# Patient Record
Sex: Female | Born: 1980 | Race: White | Hispanic: No | Marital: Married | Smoking: Never smoker
Health system: Southern US, Community
[De-identification: ages and names within clinical notes are randomized; demographics above are authoritative.]

## PROBLEM LIST (undated history)

## (undated) ENCOUNTER — Inpatient Hospital Stay (HOSPITAL_COMMUNITY): Payer: Self-pay

## (undated) DIAGNOSIS — Z98811 Dental restoration status: Secondary | ICD-10-CM

## (undated) DIAGNOSIS — J45909 Unspecified asthma, uncomplicated: Secondary | ICD-10-CM

## (undated) DIAGNOSIS — F419 Anxiety disorder, unspecified: Secondary | ICD-10-CM

## (undated) DIAGNOSIS — I341 Nonrheumatic mitral (valve) prolapse: Secondary | ICD-10-CM

## (undated) DIAGNOSIS — K4090 Unilateral inguinal hernia, without obstruction or gangrene, not specified as recurrent: Secondary | ICD-10-CM

## (undated) HISTORY — PX: MANDIBLE FRACTURE SURGERY: SHX706

## (undated) HISTORY — PX: ORIF FEMUR FRACTURE: SHX2119

## (undated) HISTORY — PX: WISDOM TOOTH EXTRACTION: SHX21

## (undated) HISTORY — PX: NASAL FRACTURE SURGERY: SHX718

## (undated) HISTORY — PX: KNEE CARTILAGE SURGERY: SHX688

---
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
## 1998-10-13 ENCOUNTER — Emergency Department (HOSPITAL_COMMUNITY): Admission: EM | Admit: 1998-10-13 | Discharge: 1998-10-13 | Payer: Self-pay

## 1998-11-17 ENCOUNTER — Other Ambulatory Visit: Admission: RE | Admit: 1998-11-17 | Discharge: 1998-11-17 | Payer: Self-pay | Admitting: Obstetrics and Gynecology

## 1998-11-27 ENCOUNTER — Emergency Department (HOSPITAL_COMMUNITY): Admission: EM | Admit: 1998-11-27 | Discharge: 1998-11-27 | Payer: Self-pay | Admitting: Emergency Medicine

## 1999-02-10 ENCOUNTER — Ambulatory Visit (HOSPITAL_BASED_OUTPATIENT_CLINIC_OR_DEPARTMENT_OTHER): Admission: RE | Admit: 1999-02-10 | Discharge: 1999-02-10 | Payer: Self-pay | Admitting: Dentistry

## 1999-03-28 HISTORY — PX: HARDWARE REMOVAL: SHX979

## 2000-04-19 ENCOUNTER — Other Ambulatory Visit: Admission: RE | Admit: 2000-04-19 | Discharge: 2000-04-19 | Payer: Self-pay | Admitting: Obstetrics and Gynecology

## 2000-07-08 ENCOUNTER — Emergency Department (HOSPITAL_COMMUNITY): Admission: EM | Admit: 2000-07-08 | Discharge: 2000-07-08 | Payer: Self-pay | Admitting: Emergency Medicine

## 2001-05-22 ENCOUNTER — Other Ambulatory Visit: Admission: RE | Admit: 2001-05-22 | Discharge: 2001-05-22 | Payer: Self-pay | Admitting: Obstetrics and Gynecology

## 2002-05-08 ENCOUNTER — Emergency Department (HOSPITAL_COMMUNITY): Admission: EM | Admit: 2002-05-08 | Discharge: 2002-05-09 | Payer: Self-pay | Admitting: Emergency Medicine

## 2002-05-08 ENCOUNTER — Encounter: Payer: Self-pay | Admitting: Emergency Medicine

## 2002-09-11 ENCOUNTER — Other Ambulatory Visit: Admission: RE | Admit: 2002-09-11 | Discharge: 2002-09-11 | Payer: Self-pay | Admitting: Obstetrics and Gynecology

## 2003-12-17 ENCOUNTER — Other Ambulatory Visit: Admission: RE | Admit: 2003-12-17 | Discharge: 2003-12-17 | Payer: Self-pay | Admitting: Obstetrics and Gynecology

## 2004-01-27 ENCOUNTER — Emergency Department (HOSPITAL_COMMUNITY): Admission: EM | Admit: 2004-01-27 | Discharge: 2004-01-27 | Payer: Self-pay

## 2004-11-17 ENCOUNTER — Ambulatory Visit: Payer: Self-pay | Admitting: Internal Medicine

## 2004-12-20 ENCOUNTER — Other Ambulatory Visit: Admission: RE | Admit: 2004-12-20 | Discharge: 2004-12-20 | Payer: Self-pay | Admitting: Obstetrics and Gynecology

## 2006-10-17 ENCOUNTER — Encounter: Payer: Self-pay | Admitting: Internal Medicine

## 2011-02-01 ENCOUNTER — Other Ambulatory Visit (HOSPITAL_COMMUNITY): Payer: Self-pay | Admitting: Obstetrics and Gynecology

## 2011-02-01 DIAGNOSIS — N971 Female infertility of tubal origin: Secondary | ICD-10-CM

## 2011-02-07 ENCOUNTER — Ambulatory Visit (HOSPITAL_COMMUNITY)
Admission: RE | Admit: 2011-02-07 | Discharge: 2011-02-07 | Payer: BC Managed Care – PPO | Source: Ambulatory Visit | Attending: Obstetrics and Gynecology | Admitting: Obstetrics and Gynecology

## 2011-02-28 ENCOUNTER — Other Ambulatory Visit (HOSPITAL_COMMUNITY): Payer: Self-pay | Admitting: Obstetrics and Gynecology

## 2011-02-28 DIAGNOSIS — N971 Female infertility of tubal origin: Secondary | ICD-10-CM

## 2011-03-06 ENCOUNTER — Other Ambulatory Visit (HOSPITAL_COMMUNITY): Payer: Self-pay | Admitting: Obstetrics and Gynecology

## 2011-03-06 ENCOUNTER — Ambulatory Visit (HOSPITAL_COMMUNITY)
Admission: RE | Admit: 2011-03-06 | Discharge: 2011-03-06 | Disposition: A | Discharge status: Home / Self Care | Payer: BC Managed Care – PPO | Source: Ambulatory Visit | Attending: Obstetrics and Gynecology | Admitting: Obstetrics and Gynecology

## 2011-03-06 DIAGNOSIS — N979 Female infertility, unspecified: Secondary | ICD-10-CM | POA: Insufficient documentation

## 2011-03-06 DIAGNOSIS — N971 Female infertility of tubal origin: Secondary | ICD-10-CM

## 2011-03-06 MED ORDER — IOHEXOL 300 MG/ML  SOLN
4.0000 mL | Freq: Once | INTRAMUSCULAR | Status: AC | PRN
  Administered 2011-03-06: 4 mL

## 2012-07-19 ENCOUNTER — Encounter (HOSPITAL_COMMUNITY): Payer: Self-pay | Admitting: *Deleted

## 2012-07-19 ENCOUNTER — Inpatient Hospital Stay (HOSPITAL_COMMUNITY)
Admission: AD | Admit: 2012-07-19 | Discharge: 2012-07-20 | Disposition: A | Discharge status: Home / Self Care | Payer: Medicaid Other | Source: Ambulatory Visit | Attending: Obstetrics and Gynecology | Admitting: Obstetrics and Gynecology

## 2012-07-19 DIAGNOSIS — O139 Gestational [pregnancy-induced] hypertension without significant proteinuria, unspecified trimester: Secondary | ICD-10-CM | POA: Insufficient documentation

## 2012-07-19 HISTORY — DX: Unspecified asthma, uncomplicated: J45.909

## 2012-07-19 LAB — URINALYSIS, ROUTINE W REFLEX MICROSCOPIC
Glucose, UA: NEGATIVE mg/dL
Protein, ur: NEGATIVE mg/dL
Specific Gravity, Urine: 1.01 (ref 1.005–1.030)
pH: 7 (ref 5.0–8.0)

## 2012-07-19 LAB — URINE MICROSCOPIC-ADD ON

## 2012-07-19 NOTE — MAU Note (Signed)
I took my B/P at home and got crazy readings. Went to [facility] and they got 172/100. I had taken it earlier today at [facility] and 146/88. Been having h/a last couple of days.

## 2012-07-20 ENCOUNTER — Encounter (HOSPITAL_COMMUNITY): Payer: Self-pay | Admitting: *Deleted

## 2012-07-20 DIAGNOSIS — O139 Gestational [pregnancy-induced] hypertension without significant proteinuria, unspecified trimester: Secondary | ICD-10-CM

## 2012-07-20 LAB — COMPREHENSIVE METABOLIC PANEL
AST: 18 U/L (ref 0–37)
Albumin: 2.9 g/dL — ABNORMAL LOW (ref 3.5–5.2)
Alkaline Phosphatase: 65 U/L (ref 39–117)
BUN: 10 mg/dL (ref 6–23)
CO2: 23 mEq/L (ref 19–32)
Chloride: 100 mEq/L (ref 96–112)
Creatinine, Ser: 0.5 mg/dL (ref 0.50–1.10)
GFR calc non Af Amer: >90 mL/min (ref >90)
Potassium: 3.6 mEq/L (ref 3.5–5.1)
Total Bilirubin: 0.2 mg/dL — ABNORMAL LOW (ref 0.3–1.2)

## 2012-07-20 LAB — CBC
HCT: 35.4 % — ABNORMAL LOW (ref 36.0–46.0)
MCV: 87.2 fL (ref 78.0–100.0)
RBC: 4.06 MIL/uL (ref 3.87–5.11)
RDW: 13.7 % (ref 11.5–15.5)
WBC: 12.2 10*3/uL — ABNORMAL HIGH (ref 4.0–10.5)

## 2012-07-20 MED ORDER — LABETALOL HCL 100 MG PO TABS
100.0000 mg | ORAL_TABLET | Freq: Once | ORAL | Status: AC
  Administered 2012-07-20: 100 mg via ORAL
  Filled 2012-07-20: qty 1

## 2012-07-20 MED ORDER — CALCIUM CARBONATE ANTACID 500 MG PO CHEW
2.0000 | CHEWABLE_TABLET | Freq: Once | ORAL | Status: AC
  Administered 2012-07-20: 400 mg via ORAL
  Filled 2012-07-20 (×2): qty 2

## 2012-07-20 MED ORDER — ACETAMINOPHEN 325 MG PO TABS
650.0000 mg | ORAL_TABLET | Freq: Once | ORAL | Status: AC
  Administered 2012-07-20: 650 mg via ORAL
  Filled 2012-07-20: qty 2
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
## 2012-08-08 ENCOUNTER — Encounter (HOSPITAL_COMMUNITY): Payer: Medicaid Other

## 2012-08-08 ENCOUNTER — Encounter (HOSPITAL_COMMUNITY): Payer: Self-pay | Admitting: *Deleted

## 2012-08-08 ENCOUNTER — Inpatient Hospital Stay (HOSPITAL_COMMUNITY)
Admission: AD | Admit: 2012-08-08 | Discharge: 2012-08-25 | DRG: 765 | Disposition: A | Discharge status: Home / Self Care | Payer: Medicaid Other | Source: Ambulatory Visit | Attending: Obstetrics and Gynecology | Admitting: Obstetrics and Gynecology

## 2012-08-08 DIAGNOSIS — I059 Rheumatic mitral valve disease, unspecified: Secondary | ICD-10-CM | POA: Diagnosis present

## 2012-08-08 DIAGNOSIS — O98519 Other viral diseases complicating pregnancy, unspecified trimester: Secondary | ICD-10-CM | POA: Diagnosis present

## 2012-08-08 DIAGNOSIS — D4959 Neoplasm of unspecified behavior of other genitourinary organ: Secondary | ICD-10-CM | POA: Diagnosis present

## 2012-08-08 DIAGNOSIS — A6000 Herpesviral infection of urogenital system, unspecified: Secondary | ICD-10-CM | POA: Diagnosis present

## 2012-08-08 DIAGNOSIS — O1414 Severe pre-eclampsia complicating childbirth: Principal | ICD-10-CM | POA: Diagnosis present

## 2012-08-08 DIAGNOSIS — R188 Other ascites: Secondary | ICD-10-CM | POA: Diagnosis present

## 2012-08-08 DIAGNOSIS — I2510 Atherosclerotic heart disease of native coronary artery without angina pectoris: Secondary | ICD-10-CM | POA: Diagnosis present

## 2012-08-08 DIAGNOSIS — O133 Gestational [pregnancy-induced] hypertension without significant proteinuria, third trimester: Secondary | ICD-10-CM

## 2012-08-08 DIAGNOSIS — O34599 Maternal care for other abnormalities of gravid uterus, unspecified trimester: Secondary | ICD-10-CM | POA: Diagnosis present

## 2012-08-08 DIAGNOSIS — D259 Leiomyoma of uterus, unspecified: Secondary | ICD-10-CM | POA: Diagnosis present

## 2012-08-08 DIAGNOSIS — O321XX0 Maternal care for breech presentation, not applicable or unspecified: Secondary | ICD-10-CM | POA: Diagnosis present

## 2012-08-08 LAB — COMPREHENSIVE METABOLIC PANEL
ALT: 14 U/L (ref 0–35)
Albumin: 2.7 g/dL — ABNORMAL LOW (ref 3.5–5.2)
Alkaline Phosphatase: 82 U/L (ref 39–117)
BUN: 10 mg/dL (ref 6–23)
Calcium: 9 mg/dL (ref 8.4–10.5)
GFR calc Af Amer: >90 mL/min (ref >90)
Glucose, Bld: 76 mg/dL (ref 70–99)
Potassium: 4.3 mEq/L (ref 3.5–5.1)
Sodium: 132 mEq/L — ABNORMAL LOW (ref 135–145)
Total Protein: 5.6 g/dL — ABNORMAL LOW (ref 6.0–8.3)

## 2012-08-08 LAB — CBC
Hemoglobin: 13.4 g/dL (ref 12.0–15.0)
MCH: 30.5 pg (ref 26.0–34.0)
MCHC: 35.7 g/dL (ref 30.0–36.0)
MCV: 85.4 fL (ref 78.0–100.0)
RBC: 4.39 MIL/uL (ref 3.87–5.11)

## 2012-08-08 LAB — URIC ACID: Uric Acid, Serum: 7.2 mg/dL — ABNORMAL HIGH (ref 2.4–7.0)

## 2012-08-08 MED ORDER — DOCUSATE SODIUM 100 MG PO CAPS
100.0000 mg | ORAL_CAPSULE | Freq: Every day | ORAL | Status: DC
  Administered 2012-08-08 – 2012-08-19 (×12): 100 mg via ORAL
  Filled 2012-08-08 (×12): qty 1

## 2012-08-08 MED ORDER — LABETALOL HCL 100 MG PO TABS
100.0000 mg | ORAL_TABLET | Freq: Three times a day (TID) | ORAL | Status: DC
  Administered 2012-08-08 – 2012-08-10 (×8): 100 mg via ORAL
  Filled 2012-08-08 (×9): qty 1

## 2012-08-08 MED ORDER — PRENATAL MULTIVITAMIN CH
1.0000 | ORAL_TABLET | Freq: Every day | ORAL | Status: DC
  Administered 2012-08-08 – 2012-08-19 (×12): 1 via ORAL
  Filled 2012-08-08 (×12): qty 1

## 2012-08-08 MED ORDER — MAGNESIUM SULFATE 40 G IN LACTATED RINGERS - SIMPLE
2.0000 g/h | INTRAVENOUS | Status: DC
  Filled 2012-08-08: qty 500

## 2012-08-08 MED ORDER — ALBUTEROL SULFATE (5 MG/ML) 0.5% IN NEBU
2.5000 mg | INHALATION_SOLUTION | RESPIRATORY_TRACT | Status: DC | PRN
  Administered 2012-08-11 – 2012-08-14 (×3): 2.5 mg via RESPIRATORY_TRACT
  Filled 2012-08-08 (×3): qty 0.5

## 2012-08-08 MED ORDER — BETAMETHASONE SOD PHOS & ACET 6 (3-3) MG/ML IJ SUSP
12.0000 mg | INTRAMUSCULAR | Status: AC
  Administered 2012-08-08 – 2012-08-09 (×2): 12 mg via INTRAMUSCULAR
  Filled 2012-08-08 (×2): qty 2

## 2012-08-08 MED ORDER — MONTELUKAST SODIUM 10 MG PO TABS
10.0000 mg | ORAL_TABLET | Freq: Every day | ORAL | Status: DC
  Administered 2012-08-08 – 2012-08-19 (×12): 10 mg via ORAL
  Filled 2012-08-08 (×13): qty 1

## 2012-08-08 MED ORDER — LACTATED RINGERS IV SOLN
INTRAVENOUS | Status: DC
  Administered 2012-08-08 – 2012-08-14 (×5): via INTRAVENOUS

## 2012-08-08 MED ORDER — MAGNESIUM SULFATE 40 MG/ML IJ SOLN
4.0000 g | Freq: Once | INTRAMUSCULAR | Status: AC
  Administered 2012-08-08: 4 g via INTRAVENOUS
  Filled 2012-08-08: qty 100

## 2012-08-08 MED ORDER — ACETAMINOPHEN 325 MG PO TABS
650.0000 mg | ORAL_TABLET | ORAL | Status: DC | PRN
  Administered 2012-08-08 – 2012-08-18 (×10): 650 mg via ORAL
  Filled 2012-08-08 (×10): qty 2

## 2012-08-08 MED ORDER — ZOLPIDEM TARTRATE 5 MG PO TABS
5.0000 mg | ORAL_TABLET | Freq: Every evening | ORAL | Status: DC | PRN
  Administered 2012-08-10 – 2012-08-20 (×9): 5 mg via ORAL
  Filled 2012-08-08 (×10): qty 1

## 2012-08-08 MED ORDER — CALCIUM CARBONATE ANTACID 500 MG PO CHEW
2.0000 | CHEWABLE_TABLET | ORAL | Status: DC | PRN
  Administered 2012-08-09: 400 mg via ORAL
  Filled 2012-08-08: qty 1
  Filled 2012-08-08: qty 2
  Filled 2012-08-08: qty 1

## 2012-08-08 MED ORDER — LABETALOL HCL 5 MG/ML IV SOLN
10.0000 mg | Freq: Once | INTRAVENOUS | Status: AC
  Administered 2012-08-08: 10 mg via INTRAVENOUS
  Filled 2012-08-08: qty 4

## 2012-08-08 NOTE — Progress Notes (Signed)
Ur chart review completed.  

## 2012-08-08 NOTE — Plan of Care (Signed)
Problem: Consults Goal: Birthing Suites Patient Information Press F2 to bring up selections list   Pt < [redacted] weeks EGA     

## 2012-08-08 NOTE — Progress Notes (Signed)
No H/A today, no epigastric pain  Lungs CTA Cor RRR Abd no epigastric tenderness DTR 1+ without clonus FHT + accels  A: Preeclampsia  P: Labs drawn     24 hour urine     Betamethasone     MFM consult     D/W patient above-all questions answered

## 2012-08-08 NOTE — Progress Notes (Signed)
D/W patient and MFM Will begin magnesium sulfate
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
## 2012-08-09 LAB — COMPREHENSIVE METABOLIC PANEL
ALT: 14 U/L (ref 0–35)
Alkaline Phosphatase: 79 U/L (ref 39–117)
BUN: 8 mg/dL (ref 6–23)
CO2: 21 mEq/L (ref 19–32)
GFR calc Af Amer: >90 mL/min (ref >90)
GFR calc non Af Amer: >90 mL/min (ref >90)
Glucose, Bld: 125 mg/dL — ABNORMAL HIGH (ref 70–99)
Potassium: 4.2 mEq/L (ref 3.5–5.1)
Sodium: 133 mEq/L — ABNORMAL LOW (ref 135–145)
Total Bilirubin: 0.2 mg/dL — ABNORMAL LOW (ref 0.3–1.2)

## 2012-08-09 LAB — URIC ACID: Uric Acid, Serum: 6.4 mg/dL (ref 2.4–7.0)

## 2012-08-09 LAB — CREATININE CLEARANCE, URINE, 24 HOUR
Creatinine, 24H Ur: 904 mg/d (ref 700–1800)
Creatinine, Urine: 40.19 mg/dL
Creatinine: 0.53 mg/dL (ref 0.50–1.10)
Urine Total Volume-CRCL: 2250 mL

## 2012-08-09 LAB — PROTEIN, URINE, 24 HOUR: Protein, 24H Urine: 4703 mg/d — ABNORMAL HIGH (ref 50–100)

## 2012-08-09 LAB — CBC
HCT: 33.9 % — ABNORMAL LOW (ref 36.0–46.0)
Hemoglobin: 11.6 g/dL — ABNORMAL LOW (ref 12.0–15.0)
MCHC: 34.2 g/dL (ref 30.0–36.0)
RBC: 3.93 MIL/uL (ref 3.87–5.11)

## 2012-08-09 MED ORDER — FAMOTIDINE 20 MG PO TABS
20.0000 mg | ORAL_TABLET | Freq: Two times a day (BID) | ORAL | Status: DC
  Administered 2012-08-09 – 2012-08-14 (×11): 20 mg via ORAL
  Filled 2012-08-09 (×12): qty 1

## 2012-08-09 NOTE — Consult Note (Signed)
Neonatology Consult to Antenatal Patient:  I was asked by Dr. Henderson Cloud to see this patient in order to provide antenatal counseling due to pre-eclampsia at [redacted] weeks GA.  Ms. was admitted yesterday at 54 0/[redacted] weeks GA due to elevated BP and proteinuria. She is currently not having active labor. She is getting BMZ and was on magnesium sulfate last evening. This is the first baby for the couple and the baby is a female.  I spoke with the patient and her husband. We discussed the worst case of delivery in the next 1-2 days, including usual DR management, possible respiratory complications and need for support, IV access, feedings (mother desires breast feeding), LOS, Mortality and Morbidity, and long term outcomes. She and her husband had several questions, which I answered. I offered a NICU tour to any interested family members and would be glad to come back if she has more questions later.  Thank you for asking me to see this patient.  Doretha Sou, MD Neonatologist  The total length of face-to-face or floor/unit time for this encounter was 30 minutes. Counseling and/or coordination of care was 20 minutes of the above.

## 2012-08-09 NOTE — Progress Notes (Signed)
Patient is resting. Complains only of mild headache.  Afebrile BP has been very labile some 170/108 now recent bp is improved on the labetalol FHR is ok no decels  Edema is marked in lower extremities and face No weight today 160 pounds yesterday in clinic per patient report Labs very stable  IMPRESSION: IUP at 29 w 1 day Probable preeclampsia  PLAN: Stable will discontinue Magnesium Daily weights starting this am Monitor for symptoms Daily labs BPP on Sunday 24 hour urine in progress Complete steroid series Will deliver if BP uncontrollable on Labetalol, fetal distress or change in lab parameters Discussed in detail with patient

## 2012-08-10 LAB — CBC
Hemoglobin: 11.9 g/dL — ABNORMAL LOW (ref 12.0–15.0)
MCH: 30.1 pg (ref 26.0–34.0)
MCHC: 34.2 g/dL (ref 30.0–36.0)
Platelets: 218 10*3/uL (ref 150–400)
RDW: 14 % (ref 11.5–15.5)

## 2012-08-10 LAB — COMPREHENSIVE METABOLIC PANEL
ALT: 12 U/L (ref 0–35)
Albumin: 2.5 g/dL — ABNORMAL LOW (ref 3.5–5.2)
Alkaline Phosphatase: 74 U/L (ref 39–117)
Calcium: 8.7 mg/dL (ref 8.4–10.5)
GFR calc Af Amer: >90 mL/min (ref >90)
Glucose, Bld: 107 mg/dL — ABNORMAL HIGH (ref 70–99)
Potassium: 4.3 mEq/L (ref 3.5–5.1)
Sodium: 131 mEq/L — ABNORMAL LOW (ref 135–145)
Total Protein: 5.2 g/dL — ABNORMAL LOW (ref 6.0–8.3)

## 2012-08-10 MED ORDER — ALBUTEROL SULFATE HFA 108 (90 BASE) MCG/ACT IN AERS
1.0000 | INHALATION_SPRAY | Freq: Four times a day (QID) | RESPIRATORY_TRACT | Status: DC | PRN
  Administered 2012-08-10 – 2012-08-11 (×4): 2 via RESPIRATORY_TRACT
  Administered 2012-08-12: 1 via RESPIRATORY_TRACT
  Administered 2012-08-12 – 2012-08-13 (×2): 2 via RESPIRATORY_TRACT
  Administered 2012-08-13: 1 via RESPIRATORY_TRACT
  Administered 2012-08-13 – 2012-08-20 (×8): 2 via RESPIRATORY_TRACT
  Filled 2012-08-10: qty 6.7

## 2012-08-10 NOTE — Plan of Care (Signed)
Spoke with NICU charge- will try to bring  pt and husband up for tour later today- charge nurse will call when ready.

## 2012-08-10 NOTE — Progress Notes (Signed)
Patient is resting. Denies Any headache.  Weight today 167  Yesterday 166 Thursday 160 BP s are better on Labetalol UOP Good  Review of 24 hour protein 4.7 gram protein LFT's normal Platelets are pending  Exam  Today I believe facial edema and lower extremity edema is less  IMPRESSION: IUP at 29 weeks 2 days Preeclampsia  PLAN: Continue daily labs, weights Continue Labetalol Given proteinuria, patient needs to stay in house BPP tomorrow Delivery if clinical parameters above worsen Plan reviewed with the patient

## 2012-08-11 ENCOUNTER — Ambulatory Visit (HOSPITAL_COMMUNITY): Payer: Medicaid Other

## 2012-08-11 LAB — COMPREHENSIVE METABOLIC PANEL
ALT: 13 U/L (ref 0–35)
Alkaline Phosphatase: 73 U/L (ref 39–117)
BUN: 18 mg/dL (ref 6–23)
CO2: 21 mEq/L (ref 19–32)
Calcium: 8.7 mg/dL (ref 8.4–10.5)
GFR calc Af Amer: >90 mL/min (ref >90)
GFR calc non Af Amer: >90 mL/min (ref >90)
Glucose, Bld: 81 mg/dL (ref 70–99)
Potassium: 4.8 mEq/L (ref 3.5–5.1)
Sodium: 134 mEq/L — ABNORMAL LOW (ref 135–145)

## 2012-08-11 LAB — CBC
HCT: 34.9 % — ABNORMAL LOW (ref 36.0–46.0)
Hemoglobin: 11.8 g/dL — ABNORMAL LOW (ref 12.0–15.0)
MCH: 30 pg (ref 26.0–34.0)
RBC: 3.93 MIL/uL (ref 3.87–5.11)

## 2012-08-11 LAB — URIC ACID: Uric Acid, Serum: 7.5 mg/dL — ABNORMAL HIGH (ref 2.4–7.0)

## 2012-08-11 MED ORDER — LABETALOL HCL 200 MG PO TABS
200.0000 mg | ORAL_TABLET | Freq: Three times a day (TID) | ORAL | Status: DC
  Administered 2012-08-11 (×3): 200 mg via ORAL
  Filled 2012-08-11 (×4): qty 1

## 2012-08-11 MED ORDER — SODIUM CHLORIDE 0.9 % IJ SOLN
3.0000 mL | Freq: Two times a day (BID) | INTRAMUSCULAR | Status: DC
  Administered 2012-08-11 – 2012-08-19 (×12): 3 mL via INTRAVENOUS

## 2012-08-11 NOTE — Plan of Care (Signed)
Problem: Consults Goal: Birthing Suites Patient Information Press F2 to bring up selections list   Pt < [redacted] weeks EGA     

## 2012-08-11 NOTE — Progress Notes (Signed)
Patient is resting. No complaints. Denies headache. Reports normal fetal movement. Went for NICU tour yesterday.  BP slightly up yesterday and last night 160/90s UOP Good Weight today is pending  Exam decreased edema in face And lower extremities with +1 pitting edema on top of feet  Labs LFTs pending Platelet stable at 218,000 BPP to be performed today  IMPRESSION: IUP at 29 w 3 days Preeclampsia  PLAN: Continue bedrest Adjust Labetalol to 200 mg po tid BPP today Daily labs Daily Weights

## 2012-08-12 LAB — CBC
HCT: 35.1 % — ABNORMAL LOW (ref 36.0–46.0)
Hemoglobin: 12.2 g/dL (ref 12.0–15.0)
MCH: 30.1 pg (ref 26.0–34.0)
MCHC: 34.4 g/dL (ref 30.0–36.0)
Platelets: 226 10*3/uL (ref 150–400)
RDW: 13.7 % (ref 11.5–15.5)
WBC: 13.6 10*3/uL — ABNORMAL HIGH (ref 4.0–10.5)

## 2012-08-12 LAB — COMPREHENSIVE METABOLIC PANEL
ALT: 28 U/L (ref 0–35)
ALT: 23 U/L (ref 0–35)
AST: 42 U/L — ABNORMAL HIGH (ref 0–37)
Albumin: 2 g/dL — ABNORMAL LOW (ref 3.5–5.2)
Albumin: 1.9 g/dL — ABNORMAL LOW (ref 3.5–5.2)
Alkaline Phosphatase: 76 U/L (ref 39–117)
BUN: 18 mg/dL (ref 6–23)
Calcium: 8.6 mg/dL (ref 8.4–10.5)
Chloride: 97 mEq/L (ref 96–112)
Potassium: 4.5 mEq/L (ref 3.5–5.1)
Sodium: 132 mEq/L — ABNORMAL LOW (ref 135–145)
Sodium: 128 mEq/L — ABNORMAL LOW (ref 135–145)
Total Bilirubin: 0.2 mg/dL — ABNORMAL LOW (ref 0.3–1.2)
Total Protein: 4.5 g/dL — ABNORMAL LOW (ref 6.0–8.3)

## 2012-08-12 LAB — ABO/RH: ABO/RH(D): A POS

## 2012-08-12 LAB — TYPE AND SCREEN

## 2012-08-12 MED ORDER — BUTALBITAL-APAP-CAFFEINE 50-325-40 MG PO TABS
1.0000 | ORAL_TABLET | Freq: Once | ORAL | Status: AC
  Administered 2012-08-12: 1 via ORAL
  Filled 2012-08-12: qty 1

## 2012-08-12 MED ORDER — LABETALOL HCL 5 MG/ML IV SOLN
10.0000 mg | Freq: Once | INTRAVENOUS | Status: AC
  Administered 2012-08-12: 10 mg via INTRAVENOUS
  Filled 2012-08-12: qty 4

## 2012-08-12 MED ORDER — BUTALBITAL-APAP-CAFFEINE 50-325-40 MG PO TABS
2.0000 | ORAL_TABLET | Freq: Once | ORAL | Status: AC
  Administered 2012-08-12: 2 via ORAL
  Filled 2012-08-12: qty 2

## 2012-08-12 MED ORDER — CITRIC ACID-SODIUM CITRATE 334-500 MG/5ML PO SOLN
ORAL | Status: AC
  Filled 2012-08-12: qty 15

## 2012-08-12 MED ORDER — LABETALOL HCL 300 MG PO TABS: 300.0000 mg | ORAL_TABLET | Freq: Three times a day (TID) | ORAL | Status: DC

## 2012-08-12 MED ORDER — ONDANSETRON 8 MG/NS 50 ML IVPB
8.0000 mg | Freq: Four times a day (QID) | INTRAVENOUS | Status: DC | PRN
  Administered 2012-08-12 – 2012-08-18 (×5): 8 mg via INTRAVENOUS
  Filled 2012-08-12 (×5): qty 8

## 2012-08-12 MED ORDER — ONDANSETRON HCL 4 MG/2ML IJ SOLN
8.0000 mg | Freq: Four times a day (QID) | INTRAMUSCULAR | Status: DC | PRN
  Filled 2012-08-12: qty 4

## 2012-08-12 MED ORDER — LACTATED RINGERS IV SOLN
INTRAVENOUS | Status: DC
  Administered 2012-08-14 – 2012-08-15 (×2): via INTRAVENOUS

## 2012-08-12 MED ORDER — LABETALOL HCL 300 MG PO TABS
300.0000 mg | ORAL_TABLET | Freq: Three times a day (TID) | ORAL | Status: DC
  Administered 2012-08-12 – 2012-08-16 (×13): 300 mg via ORAL
  Filled 2012-08-12 (×15): qty 1

## 2012-08-12 MED ORDER — MAGNESIUM SULFATE BOLUS VIA INFUSION
4.0000 g | Freq: Once | INTRAVENOUS | Status: AC
  Administered 2012-08-12: 4 g via INTRAVENOUS
  Filled 2012-08-12: qty 500

## 2012-08-12 MED ORDER — MAGNESIUM SULFATE 40 G IN LACTATED RINGERS - SIMPLE
2.0000 g/h | INTRAVENOUS | Status: DC
  Filled 2012-08-12: qty 500

## 2012-08-12 MED ORDER — LABETALOL HCL 100 MG PO TABS
100.0000 mg | ORAL_TABLET | Freq: Once | ORAL | Status: AC
Start: 2012-08-12 — End: 2012-08-12
  Administered 2012-08-12: 100 mg via ORAL
  Filled 2012-08-12: qty 1

## 2012-08-12 NOTE — Progress Notes (Signed)
Called by RN from antenatal that patient woke up and wants breathing treatment because she feels tight. After discussing pt complaint with RN i asked her to give the ordered albuterol inhaler first as we did with the 2300 treatment earlier and then if this does not help we can try the handheld albuterol. RN stated pt had some wheezing but is most likely from fluid retention but physicans are aware. I also suggested if the albuterol inhaler doesn't work we could ask physician for the albuterol handheld to be changed to a different drug. Audley Hose.

## 2012-08-12 NOTE — Progress Notes (Signed)
Ur chart review completed.  

## 2012-08-12 NOTE — Progress Notes (Signed)
08/12/12 1400  Clinical Encounter Type  Visited With Patient and family together (Husband Laurie Randolph, BIL)  Visit Type Initial;Spiritual support;Social support  Spiritual Encounters  Spiritual Needs Emotional  Stress Factors  Patient Stress Factors Loss of control   Made initial visit with Laurie Randolph, her husband Laurie Randolph, and his brother. Pt was feeling some headache and nausea, as well as emotional fatigue from dealing with uncertainty about when delivery will happen.  Family reports good support from other family, friends, and church (home church of about 20-30 people).    Provided introduction to spiritual care and chaplain availability.  Family appreciative, especially of knowing that support is there for future need.  [address] [place], [place] [phone]

## 2012-08-12 NOTE — Progress Notes (Signed)
Pt asking for additional pain medication for her h/a, stating that it does feel better but that it still throbs.  Orders received for one more fiorcet

## 2012-08-12 NOTE — Progress Notes (Signed)
Noticed that when pt assisted to BR that she is voiding around the back of the nuns hat.  discused with pt the importance of measuring urine accurately.

## 2012-08-12 NOTE — Progress Notes (Signed)
MD notified of pt headache unrelieved by plain tylenol.  Orders received.

## 2012-08-12 NOTE — Progress Notes (Signed)
Pt nauseated and unsure f she can keep her Labetalol po down.  Orders received.

## 2012-08-12 NOTE — Progress Notes (Signed)
Laurie Randolph is a 32 y.o. G1P0 at [redacted]w[redacted]d by ultrasound admitted for Pre-eclampsia.  Subjective: C/O HA but believes it's because she's been NPO.  Has not received pain meds.  No CP/SOB. Chest "tightness" she relates to asthma improved with inhaler.  No visual disturbance.  No RUQ pain.  +FM.  No VB.    Objective: BP 161/106  Pulse 66  Temp(Src) 98.5 F (36.9 C) (Oral)  Resp 20  Ht 5\' 4"  (1.626 m)  Wt 167 lb (75.751 kg)  BMI 28.65 kg/m2  SpO2 96% I/O last 3 completed shifts: In: 430 [P.O.:120; I.V.:310] Out: 0   UOP not being recorded but per pt, she is voiding q 3-4 hours and large volume  FHT:  FHR: 140 bpm, variability: moderate,  accelerations:  Present,  decelerations:  Absent UC:   none SVE:    deferred  Gen: A&O x 3 CV: RRR Pulm: exp wheezes Abd: gravid, soft, NT Ext: +edema, brisk reflexes, no clonus  Labs: Lab Results  Component Value Date   WBC 16.8* 08/12/2012   HGB 12.4 08/12/2012   HCT 36.0 08/12/2012   MCV 87.4 08/12/2012   PLT 226 08/12/2012    Assessment / Plan: 32yo G1 at [redacted]w[redacted]d with Pre-eclampsia  Labor: n/a Preeclampsia:  on magnesium sulfate, no signs or symptoms of toxicity and labs stable; liver enzymes are minimally elevated.  Will recheck q 12 hours.  S/P BMZ and NICU consult. Fetal Wellbeing:  Category I Pain Control:  n/a I/D:  n/a Anticipated MOD:  n/a  Will deliver for BPs uncontrolled by labetalol (at 300 tid now), HELLP, oliguria, neuro symptoms unresponsive to pain meds.  Laurie Randolph 08/12/2012, 8:30 AM

## 2012-08-13 LAB — COMPREHENSIVE METABOLIC PANEL
BUN: 18 mg/dL (ref 6–23)
CO2: 25 mEq/L (ref 19–32)
Calcium: 7.8 mg/dL — ABNORMAL LOW (ref 8.4–10.5)
Creatinine, Ser: 0.81 mg/dL (ref 0.50–1.10)
GFR calc Af Amer: >90 mL/min (ref >90)
GFR calc non Af Amer: >90 mL/min (ref >90)
Glucose, Bld: 107 mg/dL — ABNORMAL HIGH (ref 70–99)
Total Protein: 4.5 g/dL — ABNORMAL LOW (ref 6.0–8.3)

## 2012-08-13 LAB — CBC
HCT: 35.7 % — ABNORMAL LOW (ref 36.0–46.0)
Hemoglobin: 12.2 g/dL (ref 12.0–15.0)
MCH: 29.8 pg (ref 26.0–34.0)
MCHC: 34.2 g/dL (ref 30.0–36.0)
MCV: 87.3 fL (ref 78.0–100.0)

## 2012-08-13 NOTE — Progress Notes (Signed)
29 5/7 weeks No HA now that magnesium is off, no epigastric pain, good FM  VS  BPs 150s/90s-103 UO good Lungs CTA Cor RRR Abd no epigastric tenderness DTR  2+ without clonus FHT + accels  Results for orders placed during the hospital encounter of 08/08/12 (from the past 24 hour(s))  TYPE AND SCREEN     Status: None   Collection Time    08/12/12  9:00 AM      Result Value Range   ABO/RH(D) A POS     Antibody Screen NEG     Sample Expiration 08/15/2012    ABO/RH     Status: None   Collection Time    08/12/12  9:00 AM      Result Value Range   ABO/RH(D) A POS    CBC     Status: Abnormal   Collection Time    08/12/12  6:00 PM      Result Value Range   WBC 13.6 (*) 4.0 - 10.5 K/uL   RBC 4.03  3.87 - 5.11 MIL/uL   Hemoglobin 12.2  12.0 - 15.0 g/dL   HCT 19.1 (*) 47.8 - 29.5 %   MCV 87.1  78.0 - 100.0 fL   MCH 30.3  26.0 - 34.0 pg   MCHC 34.8  30.0 - 36.0 g/dL   RDW 62.1  30.8 - 65.7 %   Platelets 212  150 - 400 K/uL  COMPREHENSIVE METABOLIC PANEL     Status: Abnormal   Collection Time    08/12/12  6:00 PM      Result Value Range   Sodium 128 (*) 135 - 145 mEq/L   Potassium 4.5  3.5 - 5.1 mEq/L   Chloride 97  96 - 112 mEq/L   CO2 22  19 - 32 mEq/L   Glucose, Bld 106 (*) 70 - 99 mg/dL   BUN 18  6 - 23 mg/dL   Creatinine, Ser 8.46  0.50 - 1.10 mg/dL   Calcium 7.7 (*) 8.4 - 10.5 mg/dL   Total Protein 4.8 (*) 6.0 - 8.3 g/dL   Albumin 1.9 (*) 3.5 - 5.2 g/dL   AST 35  0 - 37 U/L   ALT 23  0 - 35 U/L   Alkaline Phosphatase 76  39 - 117 U/L   Total Bilirubin 0.2 (*) 0.3 - 1.2 mg/dL   GFR calc non Af Amer >90  >90 mL/min   GFR calc Af Amer >90  >90 mL/min  CBC     Status: Abnormal   Collection Time    08/13/12  6:45 AM      Result Value Range   WBC 11.4 (*) 4.0 - 10.5 K/uL   RBC 4.09  3.87 - 5.11 MIL/uL   Hemoglobin 12.2  12.0 - 15.0 g/dL   HCT 96.2 (*) 95.2 - 84.1 %   MCV 87.3  78.0 - 100.0 fL   MCH 29.8  26.0 - 34.0 pg   MCHC 34.2  30.0 - 36.0 g/dL   RDW 32.4   40.1 - 02.7 %   Platelets 213  150 - 400 K/uL  CMET today pending  A: Preeclampsia  P: Continue present care      Check CMET today      BPP 2x/week (tomorrow)      D/W patient

## 2012-08-14 ENCOUNTER — Ambulatory Visit (HOSPITAL_COMMUNITY): Payer: Medicaid Other

## 2012-08-14 LAB — COMPREHENSIVE METABOLIC PANEL
ALT: 16 U/L (ref 0–35)
ALT: 15 U/L (ref 0–35)
AST: 22 U/L (ref 0–37)
Albumin: 1.9 g/dL — ABNORMAL LOW (ref 3.5–5.2)
Albumin: 2 g/dL — ABNORMAL LOW (ref 3.5–5.2)
Alkaline Phosphatase: 84 U/L (ref 39–117)
BUN: 19 mg/dL (ref 6–23)
CO2: 26 mEq/L (ref 19–32)
Chloride: 102 mEq/L (ref 96–112)
Chloride: 99 mEq/L (ref 96–112)
Creatinine, Ser: 0.79 mg/dL (ref 0.50–1.10)
GFR calc Af Amer: >90 mL/min (ref >90)
Glucose, Bld: 83 mg/dL (ref 70–99)
Potassium: 4.6 mEq/L (ref 3.5–5.1)
Sodium: 134 mEq/L — ABNORMAL LOW (ref 135–145)
Sodium: 132 mEq/L — ABNORMAL LOW (ref 135–145)
Total Bilirubin: 0.2 mg/dL — ABNORMAL LOW (ref 0.3–1.2)
Total Bilirubin: 0.3 mg/dL (ref 0.3–1.2)
Total Protein: 4.7 g/dL — ABNORMAL LOW (ref 6.0–8.3)

## 2012-08-14 LAB — SAMPLE TO BLOOD BANK

## 2012-08-14 LAB — CBC
HCT: 36.9 % (ref 36.0–46.0)
Hemoglobin: 12.6 g/dL (ref 12.0–15.0)
MCV: 87.9 fL (ref 78.0–100.0)
Platelets: 223 10*3/uL (ref 150–400)
RBC: 4.45 MIL/uL (ref 3.87–5.11)
RDW: 13.7 % (ref 11.5–15.5)
WBC: 13.4 10*3/uL — ABNORMAL HIGH (ref 4.0–10.5)
WBC: 15.1 10*3/uL — ABNORMAL HIGH (ref 4.0–10.5)

## 2012-08-14 MED ORDER — MAGNESIUM SULFATE BOLUS VIA INFUSION
4.0000 g | Freq: Once | INTRAVENOUS | Status: AC
  Administered 2012-08-14: 4 g via INTRAVENOUS
  Filled 2012-08-14: qty 500

## 2012-08-14 MED ORDER — HYDRALAZINE HCL 20 MG/ML IJ SOLN
5.0000 mg | Freq: Once | INTRAMUSCULAR | Status: AC
  Administered 2012-08-14: 5 mg via INTRAVENOUS
  Filled 2012-08-14: qty 1

## 2012-08-14 MED ORDER — MAGNESIUM SULFATE 40 MG/ML IJ SOLN: 4.0000 g | Freq: Once | INTRAMUSCULAR | Status: DC

## 2012-08-14 MED ORDER — MAGNESIUM SULFATE 40 G IN LACTATED RINGERS - SIMPLE
2.0000 g/h | INTRAVENOUS | Status: DC
  Administered 2012-08-14 – 2012-08-15 (×2): 2 g/h via INTRAVENOUS
  Filled 2012-08-14 (×3): qty 500

## 2012-08-14 MED ORDER — PANTOPRAZOLE SODIUM 40 MG PO TBEC
40.0000 mg | DELAYED_RELEASE_TABLET | Freq: Every day | ORAL | Status: DC
  Administered 2012-08-14 – 2012-08-19 (×6): 40 mg via ORAL
  Filled 2012-08-14 (×9): qty 1

## 2012-08-14 MED ORDER — OXYCODONE-ACETAMINOPHEN 5-325 MG PO TABS
1.0000 | ORAL_TABLET | ORAL | Status: DC | PRN
  Administered 2012-08-14 – 2012-08-18 (×7): 1 via ORAL
  Filled 2012-08-14 (×8): qty 1

## 2012-08-14 MED ORDER — MAGNESIUM SULFATE 40 G IN LACTATED RINGERS - SIMPLE: 2.0000 g/h | Freq: Once | INTRAVENOUS | Status: DC

## 2012-08-14 NOTE — Progress Notes (Signed)
Patient ID: Laurie Randolph, female   DOB: 1980/08/21, 32 y.o.   MRN: [id number] [redacted]w[redacted]d  S//no current c/o HA or SOB  O// BP 150/92  Pulse 80  Temp(Src) 98.2 F (36.8 C) (Oral)  Resp 20  Ht 5\' 4"  (1.626 m)  Wt 77.565 kg (171 lb)  BMI 29.34 kg/m2  SpO2 97%  stable FHR   A+P//  See dr Henderson Cloud am note, for BPP this am, BP has stabilized, labs stable.  Already on MgSO4, discussed CS for worsening BP, labs, fetal intolerance.

## 2012-08-14 NOTE — Progress Notes (Signed)
Pt requesting neb treatment. Respiratory contacted.

## 2012-08-14 NOTE — Progress Notes (Signed)
S// c/o HA after MgSO4 , as she has exp before  O// BP 155/92  Pulse 68  Temp(Src) 97.9 F (36.6 C) (Oral)  Resp 24  Ht 5\' 4"  (1.626 m)  Wt 78.382 kg (172 lb 12.8 oz)  BMI 29.65 kg/m2  SpO2 96%   BPP 6/8  Results for orders placed during the hospital encounter of 08/08/12 (from the past 24 hour(s))  CBC     Status: Abnormal   Collection Time    08/14/12  3:30 AM      Result Value Range   WBC 13.4 (*) 4.0 - 10.5 K/uL   RBC 4.22  3.87 - 5.11 MIL/uL   Hemoglobin 12.6  12.0 - 15.0 g/dL   HCT 82.9  56.2 - 13.0 %   MCV 87.4  78.0 - 100.0 fL   MCH 29.9  26.0 - 34.0 pg   MCHC 34.1  30.0 - 36.0 g/dL   RDW 86.5  78.4 - 69.6 %   Platelets 209  150 - 400 K/uL  COMPREHENSIVE METABOLIC PANEL     Status: Abnormal   Collection Time    08/14/12  3:30 AM      Result Value Range   Sodium 134 (*) 135 - 145 mEq/L   Potassium 4.6  3.5 - 5.1 mEq/L   Chloride 102  96 - 112 mEq/L   CO2 23  19 - 32 mEq/L   Glucose, Bld 83  70 - 99 mg/dL   BUN 19  6 - 23 mg/dL   Creatinine, Ser 2.95  0.50 - 1.10 mg/dL   Calcium 8.5  8.4 - 28.4 mg/dL   Total Protein 4.7 (*) 6.0 - 8.3 g/dL   Albumin 1.9 (*) 3.5 - 5.2 g/dL   AST 21  0 - 37 U/L   ALT 16  0 - 35 U/L   Alkaline Phosphatase 84  39 - 117 U/L   Total Bilirubin 0.2 (*) 0.3 - 1.2 mg/dL   GFR calc non Af Amer >90  >90 mL/min   GFR calc Af Amer >90  >90 mL/min  SAMPLE TO BLOOD BANK     Status: None   Collection Time    08/14/12  3:30 AM      Result Value Range   Blood Bank Specimen SAMPLE AVAILABLE FOR TESTING     Sample Expiration 08/17/2012    CBC     Status: Abnormal   Collection Time    08/14/12 12:00 PM      Result Value Range   WBC 15.1 (*) 4.0 - 10.5 K/uL   RBC 4.45  3.87 - 5.11 MIL/uL   Hemoglobin 13.3  12.0 - 15.0 g/dL   HCT 13.2  44.0 - 10.2 %   MCV 87.9  78.0 - 100.0 fL   MCH 29.9  26.0 - 34.0 pg   MCHC 34.0  30.0 - 36.0 g/dL   RDW 72.5  36.6 - 44.0 %   Platelets 223  150 - 400 K/uL  COMPREHENSIVE METABOLIC PANEL     Status:  Abnormal   Collection Time    08/14/12 12:00 PM      Result Value Range   Sodium 132 (*) 135 - 145 mEq/L   Potassium 4.7  3.5 - 5.1 mEq/L   Chloride 99  96 - 112 mEq/L   CO2 26  19 - 32 mEq/L   Glucose, Bld 105 (*) 70 - 99 mg/dL   BUN 16  6 - 23 mg/dL   Creatinine,  Ser 0.79  0.50 - 1.10 mg/dL   Calcium 8.3 (*) 8.4 - 10.5 mg/dL   Total Protein 5.1 (*) 6.0 - 8.3 g/dL   Albumin 2.0 (*) 3.5 - 5.2 g/dL   AST 22  0 - 37 U/L   ALT 15  0 - 35 U/L   Alkaline Phosphatase 86  39 - 117 U/L   Total Bilirubin 0.3  0.3 - 1.2 mg/dL   GFR calc non Af Amer >90  >90 mL/min   GFR calc Af Amer >90  >90 mL/min  URIC ACID     Status: Abnormal   Collection Time    08/14/12 12:00 PM      Result Value Range   Uric Acid, Serum 8.4 (*) 2.4 - 7.0 mg/dL  A+P//   stable, will repeat labs am and tomorrow BPP + doppler studies, discussed CS for worsening BP, abnl labs, change in fetal status

## 2012-08-14 NOTE — Progress Notes (Signed)
Pt laying on arm when cuff to bp. Then pt immediately sat up in the bed and took cuff off. Pt was told bp and she stated that she felt fine just a little congested. Told pt that I will come back to recheck.

## 2012-08-14 NOTE — Progress Notes (Signed)
08/14/12 1200  Clinical Encounter Type  Visited With Health care provider (RN, [facility])   Have received report from Johnnye Sima, RN, Neurosurgeon, and from Marrion Coy, RN, regarding pt's condition and medical monitoring/discernment about c-section.  Enrika, her family, and I met personally earlier this week, and I will refer to Kathleen Argue for follow-up support.  [address] [place], [place] [phone]

## 2012-08-14 NOTE — Progress Notes (Signed)
CTSP: initially C/O feeling tight in her chest c/w her asthma. Was given breathing treatment and now breathing OK. No chest pain or leg pain or cough. No H/A or epigastric pain.  Blood pressure 152/88, pulse 74, temperature 98.2 F (36.8 C), temperature source Oral, resp. rate 18, height 5\' 4"  (1.626 m), weight 171 lb (77.565 kg), SpO2 96.00%. BPs climbing to 210s/102-112     S/P hydralazine 5mg  IV x 1, now 152/88 Lungs CTA A&P abd no epigastric tenderness, uterus soft and NT Ext NT without cords FHT + accels  Results for orders placed during the hospital encounter of 08/08/12 (from the past 24 hour(s))  URIC ACID     Status: Abnormal   Collection Time    08/13/12  6:45 AM      Result Value Range   Uric Acid, Serum 8.0 (*) 2.4 - 7.0 mg/dL  CBC     Status: Abnormal   Collection Time    08/13/12  6:45 AM      Result Value Range   WBC 11.4 (*) 4.0 - 10.5 K/uL   RBC 4.09  3.87 - 5.11 MIL/uL   Hemoglobin 12.2  12.0 - 15.0 g/dL   HCT 82.9 (*) 56.2 - 13.0 %   MCV 87.3  78.0 - 100.0 fL   MCH 29.8  26.0 - 34.0 pg   MCHC 34.2  30.0 - 36.0 g/dL   RDW 86.5  78.4 - 69.6 %   Platelets 213  150 - 400 K/uL  COMPREHENSIVE METABOLIC PANEL     Status: Abnormal   Collection Time    08/13/12  6:45 AM      Result Value Range   Sodium 133 (*) 135 - 145 mEq/L   Potassium 4.4  3.5 - 5.1 mEq/L   Chloride 100  96 - 112 mEq/L   CO2 25  19 - 32 mEq/L   Glucose, Bld 107 (*) 70 - 99 mg/dL   BUN 18  6 - 23 mg/dL   Creatinine, Ser 2.95  0.50 - 1.10 mg/dL   Calcium 7.8 (*) 8.4 - 10.5 mg/dL   Total Protein 4.5 (*) 6.0 - 8.3 g/dL   Albumin 1.9 (*) 3.5 - 5.2 g/dL   AST 25  0 - 37 U/L   ALT 20  0 - 35 U/L   Alkaline Phosphatase 82  39 - 117 U/L   Total Bilirubin 0.3  0.3 - 1.2 mg/dL   GFR calc non Af Amer >90  >90 mL/min   GFR calc Af Amer >90  >90 mL/min  CBC     Status: Abnormal   Collection Time    08/14/12  3:30 AM      Result Value Range   WBC 13.4 (*) 4.0 - 10.5 K/uL   RBC 4.22  3.87 - 5.11  MIL/uL   Hemoglobin 12.6  12.0 - 15.0 g/dL   HCT 28.4  13.2 - 44.0 %   MCV 87.4  78.0 - 100.0 fL   MCH 29.9  26.0 - 34.0 pg   MCHC 34.1  30.0 - 36.0 g/dL   RDW 10.2  72.5 - 36.6 %   Platelets 209  150 - 400 K/uL  COMPREHENSIVE METABOLIC PANEL     Status: Abnormal   Collection Time    08/14/12  3:30 AM      Result Value Range   Sodium 134 (*) 135 - 145 mEq/L   Potassium 4.6  3.5 - 5.1 mEq/L   Chloride 102  96 - 112 mEq/L   CO2 23  19 - 32 mEq/L   Glucose, Bld 83  70 - 99 mg/dL   BUN 19  6 - 23 mg/dL   Creatinine, Ser 0.98  0.50 - 1.10 mg/dL   Calcium 8.5  8.4 - 11.9 mg/dL   Total Protein 4.7 (*) 6.0 - 8.3 g/dL   Albumin 1.9 (*) 3.5 - 5.2 g/dL   AST 21  0 - 37 U/L   ALT 16  0 - 35 U/L   Alkaline Phosphatase 84  39 - 117 U/L   Total Bilirubin 0.2 (*) 0.3 - 1.2 mg/dL   GFR calc non Af Amer >90  >90 mL/min   GFR calc Af Amer >90  >90 mL/min  SAMPLE TO BLOOD BANK     Status: None   Collection Time    08/14/12  3:30 AM      Result Value Range   Blood Bank Specimen SAMPLE AVAILABLE FOR TESTING     Sample Expiration 08/17/2012     A: Preeclampsia with BP elevation  P: Magnesium Sulfate started      IV fluids, NPO, hold clot     Repeat labs 6 hours, watch BP closely     D/W patient above and possible delivery

## 2012-08-15 ENCOUNTER — Inpatient Hospital Stay (HOSPITAL_COMMUNITY): Payer: Medicaid Other

## 2012-08-15 LAB — CBC
MCHC: 34.4 g/dL (ref 30.0–36.0)
RDW: 13.8 % (ref 11.5–15.5)
WBC: 12.8 10*3/uL — ABNORMAL HIGH (ref 4.0–10.5)

## 2012-08-15 LAB — COMPREHENSIVE METABOLIC PANEL
ALT: 20 U/L (ref 0–35)
Alkaline Phosphatase: 95 U/L (ref 39–117)
BUN: 13 mg/dL (ref 6–23)
CO2: 25 mEq/L (ref 19–32)
Calcium: 8.5 mg/dL (ref 8.4–10.5)
GFR calc Af Amer: >90 mL/min (ref >90)
GFR calc non Af Amer: 85 mL/min — ABNORMAL LOW (ref >90)
Glucose, Bld: 87 mg/dL (ref 70–99)
Potassium: 4.4 mEq/L (ref 3.5–5.1)
Sodium: 130 mEq/L — ABNORMAL LOW (ref 135–145)

## 2012-08-15 NOTE — Progress Notes (Signed)
Patient ID: Laurie Randolph, female   DOB: 07-07-1980, 32 y.o.   MRN: [id number] Pt without complaints GFM  VS BPs 140-150s/80-90s  BPP 8/8 UA dopplers high normal with 3 of 8 tracings no end diastolic flow  Discussed plan of care with dr Sherrie George and with the patient Repeat Dopplers and BPP daily (at MFM) Delivery for fetal indications or maternal indications, but currently stable on [employer] tomorrow.  BPs elevated but stable DL

## 2012-08-15 NOTE — Lactation Note (Signed)
Lactation Consultation Note  Patient Name: Laurie Randolph Date: 08/15/2012   Asked by RN to see Mom. Mom had requested LC to answer some questions. Mom reports she is [redacted] weeks gestation today, but will probably deliver her baby at the latest 32 weeks due to pre-eclampsia. She is concerned she will miss her BF classes. Gave Mom the booklet we give to our Mom's at the Breastfeeding Your Baby Class. Discussed with Mom that with her baby being delivered early, will go to the NICU and may not go to the breast right away. Advised Mom that she will be set up with a DEBP within 6 hours of delivery to start pumping. Encouraged to do STS when the baby will be able. Advised Mom a LC will see her on a regular basis after delivery to assist her and answer questions. Advised that the RN's will assist as well.    Maternal Data    Feeding    LATCH Score/Interventions                      Lactation Tools Discussed/Used     Consult Status      Alfred Levins 08/15/2012, 5:09 PM

## 2012-08-15 NOTE — Progress Notes (Signed)
Ultrasound in room

## 2012-08-15 NOTE — Progress Notes (Signed)
Pt resting without complaints VS  Still elevated BPS 150-160s/80-90s  98.5 (36.9) 05/22 0350 Pulse  62  80   59  68  68 05/22 0751 Resp  18  24   18  18  18  05/22 0750 BP  143/92  187/101   157/92  157/92  157/92 05/22 0750 SpO2 (%)  94  97   95  95  95 05/22 0751     Labs stable (protein 24 h 4+grams)  Abd Gravid nt 2+ edema Dtrs 2/4  Severe Preeclampsia at 30 weeks BPP and MFM today Stable but elevated BPs on labetalol S/P BMZ/Mg CP prophylaxis  DL

## 2012-08-15 NOTE — Progress Notes (Signed)
Antenatal Nutrition Assessment:  Currently  [redacted] weeks gestation, with severe preeclampsia. Height  63.5" Weight 178 Lbs pre-pregnancy weight 142 Lbs.Pre-pregnancy  BMI 24.8  IBW 117Lbs  Total weight gain 36 Lbs ( 2+ edema). Weight gain goals 25-35 Lbs total.   Estimated needs: 17-1900 kcal/day, 60-70 grams protein/day, 1.9 liters fluid/day  Antenatal regular diet  Current diet prescription will provide for increased needs.  No abnormal nutrition related labs  Nutrition Dx: Increased nutrient needs r/t pregnancy and fetal growth requirements aeb [redacted] weeks gestation.  No educational needs assessed at this time.  Elisabeth Cara M.Odis Luster LDN Neonatal Nutrition Support Specialist Pager [phone]

## 2012-08-16 ENCOUNTER — Inpatient Hospital Stay (HOSPITAL_COMMUNITY): Payer: Medicaid Other

## 2012-08-16 LAB — CBC
Platelets: 201 10*3/uL (ref 150–400)
RBC: 4.18 MIL/uL (ref 3.87–5.11)
WBC: 12.3 10*3/uL — ABNORMAL HIGH (ref 4.0–10.5)

## 2012-08-16 LAB — COMPREHENSIVE METABOLIC PANEL
ALT: 21 U/L (ref 0–35)
AST: 36 U/L (ref 0–37)
Alkaline Phosphatase: 77 U/L (ref 39–117)
CO2: 24 mEq/L (ref 19–32)
Calcium: 7.8 mg/dL — ABNORMAL LOW (ref 8.4–10.5)
Chloride: 98 mEq/L (ref 96–112)
GFR calc non Af Amer: >90 mL/min (ref >90)
Potassium: 4.1 mEq/L (ref 3.5–5.1)
Sodium: 130 mEq/L — ABNORMAL LOW (ref 135–145)
Total Bilirubin: 0.3 mg/dL (ref 0.3–1.2)

## 2012-08-16 MED ORDER — LABETALOL HCL 300 MG PO TABS
300.0000 mg | ORAL_TABLET | Freq: Four times a day (QID) | ORAL | Status: DC
  Administered 2012-08-16 – 2012-08-19 (×12): 300 mg via ORAL
  Filled 2012-08-16 (×13): qty 1

## 2012-08-16 MED ORDER — HYDRALAZINE HCL 20 MG/ML IJ SOLN
5.0000 mg | Freq: Once | INTRAMUSCULAR | Status: AC
  Administered 2012-08-16: 5 mg via INTRAVENOUS
  Filled 2012-08-16: qty 1

## 2012-08-16 MED ORDER — POLYETHYLENE GLYCOL 3350 17 G PO PACK
17.0000 g | PACK | Freq: Every day | ORAL | Status: DC | PRN
  Administered 2012-08-16: 17 g via ORAL
  Filled 2012-08-16: qty 1

## 2012-08-16 NOTE — Progress Notes (Signed)
Patient ID: Laurie Randolph, female   DOB: 05-18-1980, 32 y.o.   MRN: [id number] Per mfm stop magnesium and follow fhr if not reactive repeat bpp

## 2012-08-16 NOTE — Progress Notes (Signed)
PO BP meds increased per MD

## 2012-08-16 NOTE — Progress Notes (Signed)
[facility] ultrasound  Indication: 32 yr old G1P0 at [redacted]w[redacted]d with severe preeclampsia and previous finding of abnormal umbilical artery Doppler studies for BPP and Doppler studies.  Findings: 1. Single intrauterine pregnancy. 2. Anterior placenta without evidence of previa. 3. Normal amniotic fluid volume. 4. Normal umbilical artery Doppler studies. 5. Biophysical profile is 6/8 (-2 for breathing). 6. Uterine fibroids are again seen the largest is 4cm.  Recommendations: 1. Severe preeclampsia: - previously counseled - s/p betamethasone - given labs are normal and patient is currently asymptomatic feel expectant management is indicated at this time - would recommend discontinue magnesium sulfate at this time - recommend continue inpatient management until delivery - recommend delivery at 34 weeks or sooner for poorly controlled blood pressures, neurologic symptoms, oliguria, evidence of HELLP syndrome, reversal of flow on Doppler studies, or nonreassuring fetal testing - if delivery indicated recommend magnesium sulfate for seizure prophylaxis through induction, delivery, and 24 hours postpartum (also to be used for fetal neuroprotection prior to [redacted] weeks gestation) - recommend at least twice weekly lab studies - recommend at least daily assessment of fetal well being - patient currently on labetalol; recommend titrate to keep blood pressures <150/100 - recommend fetal growth at least every 4 weeks (unsure when last growth was performed) 2. BPP 6/8: - recommend d/c magnesium sulfate - if fetus has reactive tracing no follow up indicated today - if not reactive recommend repeat BPP in 6 hours 3. Umbilical artery Doppler studies normal today - given previous finding of intermittent absent end diastolic flow recommend Doppler studies 3x/week  Discussed with Dr. Martie Round, MD

## 2012-08-16 NOTE — Progress Notes (Signed)
[facility] ultrasound Indication: 32 yr old G1P0 at [redacted]w[redacted]d with severe preeclampsia and previous finding of abnormal umbilical artery Doppler studies for repeat BPP.  Findings: 1. Single intrauterine pregnancy. 2. Anterior placenta without evidence of previa. 3. Normal amniotic fluid volume. 4. Normal biophysical profile of 8/8.  Recommendations: 1. Severe preeclampsia: - previously counseled - s/p betamethasone - given labs are normal and patient is currently asymptomatic feel expectant management is indicated at this time - would recommend discontinue magnesium sulfate at this time - recommend continue inpatient management until delivery - recommend delivery at 34 weeks or sooner for poorly controlled blood pressures, neurologic symptoms, oliguria, evidence of HELLP syndrome, reversal of flow on Doppler studies, or nonreassuring fetal testing - if delivery indicated recommend magnesium sulfate for seizure prophylaxis through induction, delivery, and 24 hours postpartum (also to be used for fetal neuroprotection prior to [redacted] weeks gestation) - recommend at least twice weekly lab studies - recommend at least daily assessment of fetal well being - patient currently on labetalol; recommend titrate to keep blood pressures <150/100 - recommend fetal growth at least every 4 weeks (unsure when last growth was performed) 2. BPP 8/8: - continue at least daily fetal assessment 3. Umbilical artery Doppler studies normal today - given previous finding of intermittent absent end diastolic flow recommend Doppler studies 3x/week  Eulis Foster, MD

## 2012-08-16 NOTE — Progress Notes (Signed)
Patient ID: Laurie Randolph, female   DOB: [birth year], 32 y.o.   MRN: [id number] Stopped magnesium fhr still not reactive mild accels mfm said repeat bpp

## 2012-08-16 NOTE — Progress Notes (Signed)
Patient ID: Laurie Randolph, female   DOB: [birth year], 32 y.o.   MRN: [id number] S: NO HEADACHES SCOTOMATA OR RUQ PAIN O: BP 169/96      GRAVID UTERUS NONTENDER GOOD FETAL MOVEMENT       DTR"S 3+  MARKED EDEMA       LABS OK       SONO WITH MFM A:  IUP AT 30.1 WITH SEVERE PRE ECLAMPSIA P:  CONTINUE LABETALOL       REVIEW MFM RECOMENDATIONS

## 2012-08-17 MED ORDER — HYDRALAZINE HCL 20 MG/ML IJ SOLN: 5.0000 mg | Freq: Once | INTRAMUSCULAR | Status: AC

## 2012-08-17 MED ORDER — SALINE SPRAY 0.65 % NA SOLN
1.0000 | NASAL | Status: DC | PRN
  Administered 2012-08-17: 1 via NASAL
  Filled 2012-08-17: qty 44

## 2012-08-17 MED ORDER — HYDRALAZINE HCL 20 MG/ML IJ SOLN
INTRAMUSCULAR | Status: AC
  Administered 2012-08-17: 5 mg via INTRAVENOUS
  Filled 2012-08-17: qty 1

## 2012-08-17 NOTE — Progress Notes (Signed)
Patient ID: Laurie Randolph, female   DOB: 04/03/1980, 32 y.o.   MRN: [id number] S:  NO PIH SYMPTOMS.   O: BP 158/90      FHR 140'S REASSURRING      EDEMA LESS       DTR 3 +      RECEIVED INHALER TREATMENT EARLIER DOING BETTER SAT'S 97% A:  IUP AT 30.2 WITH SEVERE PRE ECLAMPSIA P:  INCREASE DOSE OF LABETALOL YESTERDAY       MAGNESIUM OFF       MONITOR Q SHIFT       LABS IN AM

## 2012-08-18 ENCOUNTER — Inpatient Hospital Stay (HOSPITAL_COMMUNITY): Payer: Medicaid Other

## 2012-08-18 LAB — CBC
HCT: 35.4 % — ABNORMAL LOW (ref 36.0–46.0)
MCH: 29.6 pg (ref 26.0–34.0)
MCHC: 33.5 g/dL (ref 30.0–36.0)
MCHC: 34.2 g/dL (ref 30.0–36.0)
MCV: 88.3 fL (ref 78.0–100.0)
MCV: 88.1 fL (ref 78.0–100.0)
Platelets: 207 10*3/uL (ref 150–400)
RDW: 13.8 % (ref 11.5–15.5)
RDW: 13.6 % (ref 11.5–15.5)

## 2012-08-18 LAB — COMPREHENSIVE METABOLIC PANEL
ALT: 31 U/L (ref 0–35)
AST: 44 U/L — ABNORMAL HIGH (ref 0–37)
Albumin: 1.7 g/dL — ABNORMAL LOW (ref 3.5–5.2)
Alkaline Phosphatase: 87 U/L (ref 39–117)
Alkaline Phosphatase: 83 U/L (ref 39–117)
CO2: 23 mEq/L (ref 19–32)
Chloride: 103 mEq/L (ref 96–112)
Chloride: 104 mEq/L (ref 96–112)
GFR calc Af Amer: >90 mL/min (ref >90)
GFR calc non Af Amer: >90 mL/min (ref >90)
Glucose, Bld: 89 mg/dL (ref 70–99)
Potassium: 4.1 mEq/L (ref 3.5–5.1)
Potassium: 4.3 mEq/L (ref 3.5–5.1)
Sodium: 133 mEq/L — ABNORMAL LOW (ref 135–145)
Total Bilirubin: 0.3 mg/dL (ref 0.3–1.2)
Total Protein: 4.1 g/dL — ABNORMAL LOW (ref 6.0–8.3)

## 2012-08-18 MED ORDER — HYDRALAZINE HCL 20 MG/ML IJ SOLN
INTRAMUSCULAR | Status: AC
  Filled 2012-08-18: qty 1

## 2012-08-18 MED ORDER — HYDRALAZINE HCL 20 MG/ML IJ SOLN
5.0000 mg | Freq: Once | INTRAMUSCULAR | Status: AC
Start: 2012-08-18 — End: 2012-08-18
  Administered 2012-08-18: 20 mg via INTRAVENOUS

## 2012-08-18 MED ORDER — VALACYCLOVIR HCL 500 MG PO TABS
500.0000 mg | ORAL_TABLET | Freq: Every day | ORAL | Status: DC
  Administered 2012-08-18 – 2012-08-19 (×2): 500 mg via ORAL
  Filled 2012-08-18 (×4): qty 1

## 2012-08-18 MED ORDER — HYDRALAZINE HCL 20 MG/ML IJ SOLN
5.0000 mg | Freq: Once | INTRAMUSCULAR | Status: AC
  Administered 2012-08-18: 5 mg via INTRAVENOUS
  Filled 2012-08-18: qty 1

## 2012-08-18 NOTE — Progress Notes (Signed)
Pt. Returned to room from  US.

## 2012-08-18 NOTE — Progress Notes (Signed)
Patient ID: Laurie Randolph, female   DOB: 05/24/1980, 32 y.o.   MRN: [id number] lft slightly increase bp stable Recheck lab in am

## 2012-08-18 NOTE — Progress Notes (Signed)
Patient ID: Laurie Randolph, female   DOB: [birth year], 32 y.o.   MRN: [id number] S:  NO PIH SX'S  GOOD FETAL MOVEMENT O: BP 160/101  MAX 184/111      GRAVID UTERUS NONTENEDER      Dtr"S 3+      LABS SLIGHT ELEVATION OF AST PLATLETS STILL NORMAL A: IUP AT 30.3     SEVERE PIH P: RECHECK LABS THIS AFTERNOON PROCEDE WITH INDUCTION

## 2012-08-18 NOTE — Progress Notes (Signed)
Pt. To U/S 

## 2012-08-18 NOTE — Progress Notes (Signed)
Pt requesting use of inhaler before dose interval.  Slight    Expiratory wheezing noted on auscultation.  Pt BP elevated possibly from Asthma med.  Pt do not look like she is in distress.  Explained that a 5 hr interval was acceptable per Respiritory Therapist recommendation if she felt she really needed it.

## 2012-08-18 NOTE — Progress Notes (Signed)
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 She stated her last outbreak has been many years, but would feel more comfortable starting it. Orders received from Dr. Arelia Sneddon.

## 2012-08-19 ENCOUNTER — Inpatient Hospital Stay (HOSPITAL_COMMUNITY): Payer: Medicaid Other

## 2012-08-19 LAB — COMPREHENSIVE METABOLIC PANEL
Albumin: 1.6 g/dL — ABNORMAL LOW (ref 3.5–5.2)
Alkaline Phosphatase: 93 U/L (ref 39–117)
BUN: 19 mg/dL (ref 6–23)
BUN: 20 mg/dL (ref 6–23)
Calcium: 7.9 mg/dL — ABNORMAL LOW (ref 8.4–10.5)
Creatinine, Ser: 0.87 mg/dL (ref 0.50–1.10)
GFR calc Af Amer: >90 mL/min (ref >90)
Glucose, Bld: 97 mg/dL (ref 70–99)
Potassium: 4.3 mEq/L (ref 3.5–5.1)
Potassium: 4 mEq/L (ref 3.5–5.1)
Total Protein: 4 g/dL — ABNORMAL LOW (ref 6.0–8.3)
Total Protein: 4.6 g/dL — ABNORMAL LOW (ref 6.0–8.3)

## 2012-08-19 LAB — CBC
HCT: 37 % (ref 36.0–46.0)
HCT: 36 % (ref 36.0–46.0)
Hemoglobin: 12.2 g/dL (ref 12.0–15.0)
MCH: 30 pg (ref 26.0–34.0)
MCHC: 33.9 g/dL (ref 30.0–36.0)
MCV: 88.9 fL (ref 78.0–100.0)
RBC: 4.16 MIL/uL (ref 3.87–5.11)
WBC: 12.3 10*3/uL — ABNORMAL HIGH (ref 4.0–10.5)

## 2012-08-19 MED ORDER — LABETALOL HCL 200 MG PO TABS
400.0000 mg | ORAL_TABLET | Freq: Four times a day (QID) | ORAL | Status: DC
  Administered 2012-08-19 – 2012-08-20 (×4): 400 mg via ORAL
  Filled 2012-08-19 (×9): qty 2

## 2012-08-19 NOTE — Progress Notes (Signed)
Patient ID: Laurie Randolph, female   DOB: 04-Dec-1980, 32 y.o.   MRN: [id number] Pt without complaints this am GFM  No headaches or scotomata VS  BPs 140-160s/90-100  Weight 180 (stable) Now 157/94 UOP good Labs stable with mild increased AST 46 (down from yesterday) Plts 219K  FHR 140s reassuring Abd Gravid Nt  Lungs CTAB  1+ edema DTRs 3/4 2 beats clonus  IUP at 30 4/7 Severe preeclampsia Per Dr Leona Singleton note Friday, cont expectant management until fetal or maternal worsening.   Recheck labs q 12 h .  BPP and dopplers today Discussed with patient delivery options.  Vertex per bedside US last night with [place].  I offered to procede with induction v C/S now, but patient prefers to wait if she can buy a few more days.  Plan to increase labetalol to 400mg  QID (currently on 300mg  QID) .  Began on Valtex for prophylaxis but last HSV outbreak years ago and no recent sxs.  I did tell patient that if significant maternal or fetal worsening that C/S would be most expeditious and/or the baby may not tolerate labor, but the patient wants to try vaginal if possible.  DL

## 2012-08-19 NOTE — Progress Notes (Signed)
I visited with FOB while making rounds on unit, Laurie Randolph was asleep.  He is coping as well as can be expected with the uncertainty of when baby will be delivered. I offered reflective listening as he processed some of what they have been through.  [employer] Pager, [phone] 10:51 AM

## 2012-08-20 ENCOUNTER — Encounter (HOSPITAL_COMMUNITY): Payer: Self-pay | Admitting: Anesthesiology

## 2012-08-20 ENCOUNTER — Encounter (HOSPITAL_COMMUNITY): Payer: Self-pay | Admitting: Registered Nurse

## 2012-08-20 ENCOUNTER — Encounter (HOSPITAL_COMMUNITY)
Admission: AD | Disposition: A | Discharge status: Home / Self Care | Payer: Self-pay | Source: Ambulatory Visit | Attending: Obstetrics and Gynecology

## 2012-08-20 ENCOUNTER — Inpatient Hospital Stay (HOSPITAL_COMMUNITY): Payer: Medicaid Other | Admitting: Anesthesiology

## 2012-08-20 ENCOUNTER — Inpatient Hospital Stay (HOSPITAL_COMMUNITY): Payer: Medicaid Other

## 2012-08-20 LAB — COMPREHENSIVE METABOLIC PANEL
ALT: 28 U/L (ref 0–35)
AST: 34 U/L (ref 0–37)
Albumin: 1.8 g/dL — ABNORMAL LOW (ref 3.5–5.2)
Alkaline Phosphatase: 94 U/L (ref 39–117)
BUN: 19 mg/dL (ref 6–23)
Chloride: 104 mEq/L (ref 96–112)
Potassium: 4 mEq/L (ref 3.5–5.1)
Sodium: 132 mEq/L — ABNORMAL LOW (ref 135–145)
Total Bilirubin: 0.3 mg/dL (ref 0.3–1.2)

## 2012-08-20 LAB — CBC
HCT: 38.5 % (ref 36.0–46.0)
RDW: 13.8 % (ref 11.5–15.5)
WBC: 14.3 10*3/uL — ABNORMAL HIGH (ref 4.0–10.5)

## 2012-08-20 SURGERY — Surgical Case
Anesthesia: Spinal | Site: Abdomen | Wound class: Clean Contaminated

## 2012-08-20 MED ORDER — BUPIVACAINE IN DEXTROSE 0.75-8.25 % IT SOLN
INTRATHECAL | Status: DC | PRN
  Administered 2012-08-20: 10.75 mg via INTRATHECAL

## 2012-08-20 MED ORDER — ALBUTEROL SULFATE (5 MG/ML) 0.5% IN NEBU
2.5000 mg | INHALATION_SOLUTION | RESPIRATORY_TRACT | Status: DC | PRN
  Filled 2012-08-20: qty 0.5

## 2012-08-20 MED ORDER — EPHEDRINE 5 MG/ML INJ
INTRAVENOUS | Status: AC
  Filled 2012-08-20: qty 10

## 2012-08-20 MED ORDER — LANOLIN HYDROUS EX OINT: 1.0000 "application " | TOPICAL_OINTMENT | CUTANEOUS | Status: DC | PRN

## 2012-08-20 MED ORDER — CITRIC ACID-SODIUM CITRATE 334-500 MG/5ML PO SOLN
ORAL | Status: AC
  Filled 2012-08-20: qty 15

## 2012-08-20 MED ORDER — OXYCODONE-ACETAMINOPHEN 5-325 MG PO TABS
1.0000 | ORAL_TABLET | ORAL | Status: DC | PRN
  Administered 2012-08-21: 1 via ORAL
  Administered 2012-08-21 (×2): 2 via ORAL
  Administered 2012-08-22 – 2012-08-24 (×3): 1 via ORAL
  Filled 2012-08-20 (×2): qty 2
  Filled 2012-08-20 (×2): qty 1
  Filled 2012-08-20 (×2): qty 2

## 2012-08-20 MED ORDER — CLINDAMYCIN PHOSPHATE 900 MG/50ML IV SOLN: 900.0000 mg | Freq: Once | INTRAVENOUS | Status: DC

## 2012-08-20 MED ORDER — SIMETHICONE 80 MG PO CHEW
80.0000 mg | CHEWABLE_TABLET | ORAL | Status: DC | PRN
  Administered 2012-08-20: 80 mg via ORAL

## 2012-08-20 MED ORDER — ONDANSETRON HCL 4 MG/2ML IJ SOLN
INTRAMUSCULAR | Status: DC | PRN
  Administered 2012-08-20: 4 mg via INTRAVENOUS

## 2012-08-20 MED ORDER — GENTAMICIN SULFATE 40 MG/ML IJ SOLN
Freq: Once | INTRAVENOUS | Status: AC
  Administered 2012-08-20: 100 mL via INTRAVENOUS
  Filled 2012-08-20: qty 8.21

## 2012-08-20 MED ORDER — SIMETHICONE 80 MG PO CHEW
80.0000 mg | CHEWABLE_TABLET | Freq: Three times a day (TID) | ORAL | Status: DC
  Administered 2012-08-21 – 2012-08-24 (×10): 80 mg via ORAL

## 2012-08-20 MED ORDER — MAGNESIUM SULFATE BOLUS VIA INFUSION
4.0000 g | Freq: Once | INTRAVENOUS | Status: AC
  Administered 2012-08-20: 4 g via INTRAVENOUS
  Filled 2012-08-20: qty 500

## 2012-08-20 MED ORDER — SENNOSIDES-DOCUSATE SODIUM 8.6-50 MG PO TABS
2.0000 | ORAL_TABLET | Freq: Every day | ORAL | Status: DC
  Administered 2012-08-20 – 2012-08-22 (×3): 2 via ORAL

## 2012-08-20 MED ORDER — METOCLOPRAMIDE HCL 5 MG/ML IJ SOLN: 10.0000 mg | Freq: Three times a day (TID) | INTRAMUSCULAR | Status: DC | PRN

## 2012-08-20 MED ORDER — FENTANYL CITRATE 0.05 MG/ML IJ SOLN: 25.0000 ug | INTRAMUSCULAR | Status: DC | PRN

## 2012-08-20 MED ORDER — METOCLOPRAMIDE HCL 5 MG/ML IJ SOLN
10.0000 mg | Freq: Once | INTRAMUSCULAR | Status: AC
  Administered 2012-08-20: 10 mg via INTRAVENOUS
  Filled 2012-08-20: qty 2

## 2012-08-20 MED ORDER — MAGNESIUM SULFATE 40 G IN LACTATED RINGERS - SIMPLE
2.0000 g/h | INTRAVENOUS | Status: DC
  Administered 2012-08-21: 2 g/h via INTRAVENOUS
  Filled 2012-08-20: qty 500

## 2012-08-20 MED ORDER — MAGNESIUM SULFATE 40 G IN LACTATED RINGERS - SIMPLE
2.0000 g/h | INTRAVENOUS | Status: DC
  Administered 2012-08-20: 2 g/h via INTRAVENOUS
  Filled 2012-08-20: qty 500

## 2012-08-20 MED ORDER — LACTATED RINGERS IV SOLN
INTRAVENOUS | Status: DC | PRN
  Administered 2012-08-20: 09:00:00 via INTRAVENOUS

## 2012-08-20 MED ORDER — NALBUPHINE SYRINGE 5 MG/0.5 ML
INJECTION | INTRAMUSCULAR | Status: AC
  Filled 2012-08-20: qty 0.5

## 2012-08-20 MED ORDER — LABETALOL HCL 300 MG PO TABS
300.0000 mg | ORAL_TABLET | Freq: Three times a day (TID) | ORAL | Status: DC
  Administered 2012-08-20 (×2): 300 mg via ORAL
  Administered 2012-08-21: 400 mg via ORAL
  Administered 2012-08-21 – 2012-08-22 (×6): 300 mg via ORAL
  Filled 2012-08-20 (×9): qty 1

## 2012-08-20 MED ORDER — KETOROLAC TROMETHAMINE 60 MG/2ML IM SOLN
INTRAMUSCULAR | Status: AC
  Filled 2012-08-20: qty 2

## 2012-08-20 MED ORDER — FUROSEMIDE 10 MG/ML IJ SOLN
INTRAMUSCULAR | Status: DC | PRN
  Administered 2012-08-20: 20 mg via INTRAVENOUS

## 2012-08-20 MED ORDER — WITCH HAZEL-GLYCERIN EX PADS: 1.0000 "application " | MEDICATED_PAD | CUTANEOUS | Status: DC | PRN

## 2012-08-20 MED ORDER — SODIUM CHLORIDE 0.9 % IJ SOLN
INTRAMUSCULAR | Status: AC
  Filled 2012-08-20: qty 3

## 2012-08-20 MED ORDER — TETANUS-DIPHTH-ACELL PERTUSSIS 5-2.5-18.5 LF-MCG/0.5 IM SUSP
0.5000 mL | Freq: Once | INTRAMUSCULAR | Status: DC
  Filled 2012-08-20: qty 0.5

## 2012-08-20 MED ORDER — ONDANSETRON HCL 4 MG/2ML IJ SOLN: 4.0000 mg | Freq: Three times a day (TID) | INTRAMUSCULAR | Status: DC | PRN

## 2012-08-20 MED ORDER — DIPHENHYDRAMINE HCL 50 MG/ML IJ SOLN: 25.0000 mg | INTRAMUSCULAR | Status: DC | PRN

## 2012-08-20 MED ORDER — MEPERIDINE HCL 25 MG/ML IJ SOLN
INTRAMUSCULAR | Status: AC
  Filled 2012-08-20: qty 1

## 2012-08-20 MED ORDER — NALOXONE HCL 0.4 MG/ML IJ SOLN: 0.4000 mg | INTRAMUSCULAR | Status: DC | PRN

## 2012-08-20 MED ORDER — GENTAMICIN SULFATE 40 MG/ML IJ SOLN: 1.5000 mg/kg | Freq: Once | INTRAVENOUS | Status: DC

## 2012-08-20 MED ORDER — MORPHINE SULFATE 0.5 MG/ML IJ SOLN
INTRAMUSCULAR | Status: AC
  Filled 2012-08-20: qty 10

## 2012-08-20 MED ORDER — DIPHENHYDRAMINE HCL 50 MG/ML IJ SOLN: 12.5000 mg | INTRAMUSCULAR | Status: DC | PRN

## 2012-08-20 MED ORDER — ONDANSETRON HCL 4 MG/2ML IJ SOLN: 4.0000 mg | INTRAMUSCULAR | Status: DC | PRN

## 2012-08-20 MED ORDER — FUROSEMIDE 10 MG/ML IJ SOLN
INTRAMUSCULAR | Status: AC
  Filled 2012-08-20: qty 2

## 2012-08-20 MED ORDER — SCOPOLAMINE 1 MG/3DAYS TD PT72
MEDICATED_PATCH | TRANSDERMAL | Status: AC
  Filled 2012-08-20: qty 1

## 2012-08-20 MED ORDER — FENTANYL CITRATE 0.05 MG/ML IJ SOLN
INTRAMUSCULAR | Status: DC | PRN
Start: 2012-08-20 — End: 2012-08-20
  Administered 2012-08-20: 15 ug via INTRATHECAL

## 2012-08-20 MED ORDER — FENTANYL CITRATE 0.05 MG/ML IJ SOLN
INTRAMUSCULAR | Status: AC
  Filled 2012-08-20: qty 2

## 2012-08-20 MED ORDER — OXYTOCIN 10 UNIT/ML IJ SOLN
40.0000 [IU] | INTRAVENOUS | Status: DC | PRN
  Administered 2012-08-20: 40 [IU] via INTRAVENOUS

## 2012-08-20 MED ORDER — PHENYLEPHRINE HCL 10 MG/ML IJ SOLN
INTRAMUSCULAR | Status: DC | PRN
  Administered 2012-08-20 (×2): 40 ug via INTRAVENOUS
  Administered 2012-08-20: 80 ug via INTRAVENOUS
  Administered 2012-08-20 (×8): 40 ug via INTRAVENOUS
  Administered 2012-08-20: 80 ug via INTRAVENOUS
  Administered 2012-08-20: 40 ug via INTRAVENOUS

## 2012-08-20 MED ORDER — PHENYLEPHRINE 40 MCG/ML (10ML) SYRINGE FOR IV PUSH (FOR BLOOD PRESSURE SUPPORT)
PREFILLED_SYRINGE | INTRAVENOUS | Status: AC
  Filled 2012-08-20: qty 5

## 2012-08-20 MED ORDER — IBUPROFEN 600 MG PO TABS
600.0000 mg | ORAL_TABLET | Freq: Four times a day (QID) | ORAL | Status: DC
  Administered 2012-08-20 – 2012-08-25 (×19): 600 mg via ORAL
  Filled 2012-08-20 (×18): qty 1

## 2012-08-20 MED ORDER — ALBUTEROL SULFATE HFA 108 (90 BASE) MCG/ACT IN AERS
2.0000 | INHALATION_SPRAY | Freq: Four times a day (QID) | RESPIRATORY_TRACT | Status: DC | PRN
  Administered 2012-08-24 (×2): 2 via RESPIRATORY_TRACT
  Filled 2012-08-20: qty 6.7

## 2012-08-20 MED ORDER — FUROSEMIDE 10 MG/ML IJ SOLN
20.0000 mg | Freq: Once | INTRAMUSCULAR | Status: AC
  Administered 2012-08-20: 20 mg via INTRAVENOUS
  Filled 2012-08-20: qty 2

## 2012-08-20 MED ORDER — NALBUPHINE HCL 10 MG/ML IJ SOLN
5.0000 mg | INTRAMUSCULAR | Status: DC | PRN
  Filled 2012-08-20: qty 1

## 2012-08-20 MED ORDER — ONDANSETRON HCL 4 MG PO TABS: 4.0000 mg | ORAL_TABLET | ORAL | Status: DC | PRN

## 2012-08-20 MED ORDER — OXYTOCIN 40 UNITS IN LACTATED RINGERS INFUSION - SIMPLE MED
62.5000 mL/h | INTRAVENOUS | Status: DC
  Administered 2012-08-20: 62.5 mL/h via INTRAVENOUS

## 2012-08-20 MED ORDER — DIPHENHYDRAMINE HCL 25 MG PO CAPS
25.0000 mg | ORAL_CAPSULE | ORAL | Status: DC | PRN
  Filled 2012-08-20: qty 1

## 2012-08-20 MED ORDER — BUPIVACAINE HCL (PF) 0.25 % IJ SOLN
INTRAMUSCULAR | Status: AC
  Filled 2012-08-20: qty 30

## 2012-08-20 MED ORDER — FAMOTIDINE IN NACL 20-0.9 MG/50ML-% IV SOLN
20.0000 mg | Freq: Once | INTRAVENOUS | Status: AC
  Administered 2012-08-20: 20 mg via INTRAVENOUS
  Filled 2012-08-20: qty 50

## 2012-08-20 MED ORDER — LACTATED RINGERS IV SOLN
INTRAVENOUS | Status: DC
  Administered 2012-08-20 – 2012-08-21 (×2): via INTRAVENOUS

## 2012-08-20 MED ORDER — KETOROLAC TROMETHAMINE 60 MG/2ML IM SOLN
60.0000 mg | Freq: Once | INTRAMUSCULAR | Status: AC | PRN
  Administered 2012-08-20: 60 mg via INTRAMUSCULAR

## 2012-08-20 MED ORDER — OXYTOCIN 10 UNIT/ML IJ SOLN
INTRAMUSCULAR | Status: AC
  Filled 2012-08-20: qty 4

## 2012-08-20 MED ORDER — KETOROLAC TROMETHAMINE 30 MG/ML IJ SOLN
30.0000 mg | Freq: Four times a day (QID) | INTRAMUSCULAR | Status: DC | PRN
  Filled 2012-08-20: qty 1

## 2012-08-20 MED ORDER — DIPHENHYDRAMINE HCL 25 MG PO CAPS: 25.0000 mg | ORAL_CAPSULE | Freq: Four times a day (QID) | ORAL | Status: DC | PRN

## 2012-08-20 MED ORDER — EPHEDRINE SULFATE 50 MG/ML IJ SOLN
INTRAMUSCULAR | Status: DC | PRN
  Administered 2012-08-20: 15 mg via INTRAVENOUS
  Administered 2012-08-20 (×5): 10 mg via INTRAVENOUS
  Administered 2012-08-20: 15 mg via INTRAVENOUS
  Administered 2012-08-20 (×2): 10 mg via INTRAVENOUS
  Administered 2012-08-20: 20 mg via INTRAVENOUS
  Administered 2012-08-20: 10 mg via INTRAVENOUS
  Administered 2012-08-20: 5 mg via INTRAVENOUS
  Administered 2012-08-20: 15 mg via INTRAVENOUS

## 2012-08-20 MED ORDER — DIBUCAINE 1 % RE OINT
1.0000 "application " | TOPICAL_OINTMENT | RECTAL | Status: DC | PRN
  Filled 2012-08-20: qty 28

## 2012-08-20 MED ORDER — ZOLPIDEM TARTRATE 5 MG PO TABS
5.0000 mg | ORAL_TABLET | Freq: Every evening | ORAL | Status: DC | PRN
  Administered 2012-08-21 – 2012-08-24 (×3): 5 mg via ORAL
  Filled 2012-08-20 (×3): qty 1

## 2012-08-20 MED ORDER — MENTHOL 3 MG MT LOZG
1.0000 | LOZENGE | OROMUCOSAL | Status: DC | PRN
  Filled 2012-08-20: qty 9

## 2012-08-20 MED ORDER — PRENATAL MULTIVITAMIN CH
1.0000 | ORAL_TABLET | Freq: Every day | ORAL | Status: DC
  Administered 2012-08-21 – 2012-08-24 (×4): 1 via ORAL
  Filled 2012-08-20 (×4): qty 1

## 2012-08-20 MED ORDER — ONDANSETRON HCL 4 MG/2ML IJ SOLN
INTRAMUSCULAR | Status: AC
  Filled 2012-08-20: qty 2

## 2012-08-20 MED ORDER — NALBUPHINE HCL 10 MG/ML IJ SOLN
5.0000 mg | INTRAMUSCULAR | Status: DC | PRN
  Administered 2012-08-20: 5 mg via SUBCUTANEOUS
  Filled 2012-08-20: qty 1

## 2012-08-20 MED ORDER — NALOXONE HCL 1 MG/ML IJ SOLN: 1.0000 ug/kg/h | INTRAVENOUS | Status: DC | PRN

## 2012-08-20 MED ORDER — LACTATED RINGERS IV SOLN
INTRAVENOUS | Status: DC | PRN
  Administered 2012-08-20: 10:00:00 via INTRAVENOUS

## 2012-08-20 MED ORDER — MONTELUKAST SODIUM 10 MG PO TABS
10.0000 mg | ORAL_TABLET | Freq: Every day | ORAL | Status: DC
  Administered 2012-08-20 – 2012-08-24 (×5): 10 mg via ORAL
  Filled 2012-08-20 (×5): qty 1

## 2012-08-20 MED ORDER — MORPHINE SULFATE (PF) 0.5 MG/ML IJ SOLN
INTRAMUSCULAR | Status: DC | PRN
  Administered 2012-08-20: .1 mg via INTRATHECAL

## 2012-08-20 MED ORDER — CITRIC ACID-SODIUM CITRATE 334-500 MG/5ML PO SOLN
ORAL | Status: DC | PRN
  Administered 2012-08-20: 30 mL via ORAL

## 2012-08-20 MED ORDER — SCOPOLAMINE 1 MG/3DAYS TD PT72
1.0000 | MEDICATED_PATCH | Freq: Once | TRANSDERMAL | Status: AC
  Administered 2012-08-20: 1.5 mg via TRANSDERMAL

## 2012-08-20 MED ORDER — MEPERIDINE HCL 25 MG/ML IJ SOLN
6.2500 mg | INTRAMUSCULAR | Status: DC | PRN
  Administered 2012-08-20: 6.25 mg via INTRAVENOUS

## 2012-08-20 MED ORDER — SODIUM CHLORIDE 0.9 % IJ SOLN: 3.0000 mL | INTRAMUSCULAR | Status: DC | PRN

## 2012-08-20 MED ORDER — 0.9 % SODIUM CHLORIDE (POUR BTL) OPTIME
TOPICAL | Status: DC | PRN
  Administered 2012-08-20: 1000 mL

## 2012-08-20 SURGICAL SUPPLY — 34 items
BARRIER ADHS 3X4 INTERCEED (GAUZE/BANDAGES/DRESSINGS) IMPLANT
BRR ADH 4X3 ABS CNTRL BYND (GAUZE/BANDAGES/DRESSINGS)
CLAMP CORD UMBIL (MISCELLANEOUS) ×1 IMPLANT
CLOTH BEACON ORANGE TIMEOUT ST (SAFETY) ×2 IMPLANT
CONTAINER PREFILL 10% NBF 15ML (MISCELLANEOUS) IMPLANT
DRAPE LG THREE QUARTER DISP (DRAPES) ×2 IMPLANT
DRSG OPSITE POSTOP 4X10 (GAUZE/BANDAGES/DRESSINGS) ×2 IMPLANT
DURAPREP 26ML APPLICATOR (WOUND CARE) ×2 IMPLANT
ELECT REM PT RETURN 9FT ADLT (ELECTROSURGICAL) ×2
ELECTRODE REM PT RTRN 9FT ADLT (ELECTROSURGICAL) ×1 IMPLANT
EXTRACTOR VACUUM M CUP 4 TUBE (SUCTIONS) IMPLANT
GAUZE SPONGE 4X4 12PLY STRL LF (GAUZE/BANDAGES/DRESSINGS) ×1 IMPLANT
GLOVE BIO SURGEON STRL SZ 6.5 (GLOVE) ×2 IMPLANT
GOWN STRL REIN XL XLG (GOWN DISPOSABLE) ×4 IMPLANT
KIT ABG SYR 3ML LUER SLIP (SYRINGE) IMPLANT
NDL HYPO 25X5/8 SAFETYGLIDE (NEEDLE) ×1 IMPLANT
NEEDLE HYPO 22GX1.5 SAFETY (NEEDLE) ×1 IMPLANT
NEEDLE HYPO 25X5/8 SAFETYGLIDE (NEEDLE) IMPLANT
NS IRRIG 1000ML POUR BTL (IV SOLUTION) ×2 IMPLANT
PACK C SECTION WH (CUSTOM PROCEDURE TRAY) ×2 IMPLANT
PAD ABD 7.5X8 STRL (GAUZE/BANDAGES/DRESSINGS) ×1 IMPLANT
PAD OB MATERNITY 4.3X12.25 (PERSONAL CARE ITEMS) ×2 IMPLANT
STAPLER VISISTAT 35W (STAPLE) ×1 IMPLANT
SUT CHROMIC 0 CTX 36 (SUTURE) ×7 IMPLANT
SUT PLAIN 0 NONE (SUTURE) IMPLANT
SUT PLAIN 2 0 XLH (SUTURE) ×1 IMPLANT
SUT VIC AB 0 CT1 27 (SUTURE) ×8
SUT VIC AB 0 CT1 27XBRD ANBCTR (SUTURE) ×3 IMPLANT
SUT VIC AB 4-0 KS 27 (SUTURE) IMPLANT
SYR CONTROL 10ML LL (SYRINGE) IMPLANT
TAPE CLOTH SURG 4X10 WHT LF (GAUZE/BANDAGES/DRESSINGS) ×1 IMPLANT
TOWEL OR 17X24 6PK STRL BLUE (TOWEL DISPOSABLE) ×6 IMPLANT
TRAY FOLEY CATH 14FR (SET/KITS/TRAYS/PACK) ×2 IMPLANT
WATER STERILE IRR 1000ML POUR (IV SOLUTION) ×1 IMPLANT

## 2012-08-20 NOTE — Progress Notes (Signed)
Pt just finishing with lactation pumping.  Pt upset that her brother in law had "walked in on her" and saw her breasts.  Pt instructed that her BP is very high and to lie back and try to take a nap.  Family members at bedside encouraged to allow pt to nap.

## 2012-08-20 NOTE — Progress Notes (Signed)
MD updated on pt status and BP and urine output.  Per MD pt may visit her baby in the NICU and then will get Lasix 20 mg IV when she returns from NICU

## 2012-08-20 NOTE — Anesthesia Procedure Notes (Signed)
Spinal  Patient location during procedure: OR Start time: 08/20/2012 9:28 AM Staffing Performed by: anesthesiologist  Preanesthetic Checklist Completed: patient identified, site marked, surgical consent, pre-op evaluation, timeout performed, IV checked, risks and benefits discussed and monitors and equipment checked Spinal Block Patient position: sitting Prep: site prepped and draped and DuraPrep Patient monitoring: heart rate, cardiac monitor, continuous pulse ox and blood pressure Approach: midline Location: L3-4 Injection technique: single-shot Needle Needle type: Sprotte  Needle gauge: 24 G Needle length: 9 cm Assessment Sensory level: T4 Additional Notes Clear free flow CSF on first attempt.  No paresthesia.  Patient tolerated procedure well with no apparent complications.  Jasmine December, MD

## 2012-08-20 NOTE — Brief Op Note (Signed)
08/08/2012 - 08/20/2012  10:18 AM  PATIENT:  Laurie Randolph  32 y.o. female  PRE-OPERATIVE DIAGNOSIS:  Severe Preeclampsia, IUP at 30 w 5 days, Breech  POST-OPERATIVE DIAGNOSIS: Severe Preeclampsia, Fibroids, Ascites, IUP at 30 w 5 days  PROCEDURE:  Procedure(s): CESAREAN SECTION (N/A)  SURGEON:  Surgeon(s) and Role:    * Jeani Hawking, MD - Primary  PHYSICIAN ASSISTANT:   ASSISTANTS: none   ANESTHESIA:   spinal  EBL:  Total I/O In: 1300 [I.V.:1300] Out: 800 [Urine:100; Blood:700]  BLOOD ADMINISTERED:none  DRAINS: Urinary Catheter (Foley)   LOCAL MEDICATIONS USED:  NONE  SPECIMEN:  Source of Specimen:  placenta  DISPOSITION OF SPECIMEN:  PATHOLOGY  COUNTS:  YES  TOURNIQUET:  * No tourniquets in log *  DICTATION: .Other Dictation: Dictation Number [id number]  PLAN OF CARE: Admit to inpatient   PATIENT DISPOSITION:  ICU - extubated and stable.   Delay start of Pharmacological VTE agent (>24hrs) due to surgical blood loss or risk of bleeding: not applicable

## 2012-08-20 NOTE — Anesthesia Preprocedure Evaluation (Addendum)
Anesthesia Evaluation  Patient identified by MRN, date of birth, ID band Patient awake    Reviewed: Allergy & Precautions, H&P , NPO status , Patient's Chart, lab work & pertinent test results, reviewed documented beta blocker date and time   History of Anesthesia Complications (+) PONV  Airway Mallampati: II TM Distance: >3 FB Neck ROM: full    Dental  (+) Teeth Intact   Pulmonary asthma (using albuterol every 6 hours since on bedrest, normally uses every 3 weeks (exercise, allergen, heat induced - asthma since childhood, multiple hospitalizations, no intubations)) ,  breath sounds clear to auscultation        Cardiovascular hypertension (severe preeclapmsia), Rhythm:regular Rate:Normal     Neuro/Psych negative neurological ROS  negative psych ROS   GI/Hepatic negative GI ROS, Neg liver ROS,   Endo/Other  negative endocrine ROS  Renal/GU negative Renal ROS  negative genitourinary   Musculoskeletal   Abdominal   Peds  Hematology negative hematology ROS (+)   Anesthesia Other Findings Ate banana at 7 am  Reproductive/Obstetrics (+) Pregnancy (severe preeclampsia, [redacted]w[redacted]d, breech)                          Anesthesia Physical Anesthesia Plan  ASA: III and emergent  Anesthesia Plan: Spinal   Post-op Pain Management:    Induction:   Airway Management Planned:   Additional Equipment:   Intra-op Plan:   Post-operative Plan:   Informed Consent: I have reviewed the patients History and Physical, chart, labs and discussed the procedure including the risks, benefits and alternatives for the proposed anesthesia with the patient or authorized representative who has indicated his/her understanding and acceptance.   Dental Advisory Given  Plan Discussed with: Surgeon and CRNA  Anesthesia Plan Comments:         Anesthesia Quick Evaluation

## 2012-08-20 NOTE — Anesthesia Postprocedure Evaluation (Signed)
  Anesthesia Post-op Note  Anesthesia Post Note  Patient: Laurie Randolph  Procedure(s) Performed: Procedure(s) (LRB): CESAREAN SECTION (N/A)  Anesthesia type: Spinal  Patient location: PACU  Post pain: Pain level controlled  Post assessment: Post-op Vital signs reviewed  Last Vitals:  Filed Vitals:   08/20/12 1115  BP: 148/93  Pulse: 60  Temp: 36.6 C  Resp: 15    Post vital signs: Reviewed  Level of consciousness: awake  Complications: No apparent anesthesia complications

## 2012-08-20 NOTE — Progress Notes (Signed)
Laurie Randolph  was seen today for an ultrasound appointment.  See full report in [facility].  Comments: [name] returns today for BPP and UA Dopplers due to severe preeclampsia.  She is currently on 400 mg Labetolol q 6 hrs and continues to have severe range blood pressures on the antepartum floor.  Since admission, she has experienced 31 lbs weight gain.  Ultrasound today reveals an estimated fetal weight at the 14th %tile with asymmetric growth (AC < 3rd %tile).  The umbilical artery Doppler studies are elevated but without evidence of absent or reversed diastolic flow.  The fetus is active with a BPP of 8/8.  Impression: Single IUP at 30 5/7 weeks Severe preeclampsia Estimated fetal weight at the 14th %tile with asymmetric growth (AC < 3rd %tile) Elevated UA Doppler studies without evidence of absent or reveresed flow Breech presentation BPP 8/8 Normal amniotic fluid volume  Anterior lower uterine segment myoma (see above)  Recommendations: Recommend delivery due to severe range blood pressures despite antihypertensive medications. Magnesium sulfate prophylaxis Cesarean section due to breech presentation  Alpha Gula, MD

## 2012-08-20 NOTE — Progress Notes (Signed)
MD notified of pt VS and CBC report.  CMET pending and MFM calling for pt to come for u/s.  Per MD take pt to MFM and MD will go and talk to MFM dr.

## 2012-08-20 NOTE — Progress Notes (Signed)
Patient is resting BP 177/112 UOP has been 700 cc since OR Did receive 20mg  Lasix in the OR Facial edema is less from this am Will restart Labetalol at 300 mg po tid Possibly readminister Lasix Continue Magnesium

## 2012-08-20 NOTE — Transfer of Care (Signed)
Immediate Anesthesia Transfer of Care Note  Patient: Laurie Randolph  Procedure(s) Performed: Procedure(s): CESAREAN SECTION (N/A)  Patient Location: PACU  Anesthesia Type:Spinal  Level of Consciousness: awake, alert  and oriented  Airway & Oxygen Therapy: Patient Spontanous Breathing and Patient connected to nasal cannula oxygen  Post-op Assessment: Report given to PACU RN  Post vital signs: Reviewed  Complications: No apparent anesthesia complications

## 2012-08-20 NOTE — Progress Notes (Signed)
Assumed care of patient at 0730. Reviewed hospital course. Gained 32 pounds in 2 weeks On 1600 mg labetalol per day since this weekend - on qid dosing instead of tid. Urine Output is markedly decreased CBC and CMET stable Review with MFM and ultrasound -abnormal dopplers and growth of fetus is decreased 14% He strongly recommends delivery today Should have been delivered before the labetalol was increased. Baby is breech Will proceed with Primary LTCS Risks reviewed with patient Will start Magnesium now OR notified

## 2012-08-20 NOTE — Preoperative (Signed)
Beta Blockers   Reason not to administer Beta Blockers:Not Applicable 

## 2012-08-21 ENCOUNTER — Encounter (HOSPITAL_COMMUNITY): Payer: Self-pay | Admitting: Obstetrics and Gynecology

## 2012-08-21 LAB — BASIC METABOLIC PANEL
BUN: 21 mg/dL (ref 6–23)
CO2: 22 mEq/L (ref 19–32)
Glucose, Bld: 99 mg/dL (ref 70–99)
Potassium: 3.8 mEq/L (ref 3.5–5.1)
Sodium: 130 mEq/L — ABNORMAL LOW (ref 135–145)

## 2012-08-21 LAB — CBC
MCH: 30.1 pg (ref 26.0–34.0)
MCHC: 34.1 g/dL (ref 30.0–36.0)
MCV: 88.5 fL (ref 78.0–100.0)
Platelets: 250 10*3/uL (ref 150–400)
RDW: 14 % (ref 11.5–15.5)

## 2012-08-21 LAB — COMPREHENSIVE METABOLIC PANEL
AST: 44 U/L — ABNORMAL HIGH (ref 0–37)
CO2: 21 mEq/L (ref 19–32)
Calcium: 7.5 mg/dL — ABNORMAL LOW (ref 8.4–10.5)
Creatinine, Ser: 0.88 mg/dL (ref 0.50–1.10)
GFR calc non Af Amer: 85 mL/min — ABNORMAL LOW (ref >90)
Total Protein: 4.4 g/dL — ABNORMAL LOW (ref 6.0–8.3)

## 2012-08-21 MED ORDER — LABETALOL HCL 300 MG PO TABS
300.0000 mg | ORAL_TABLET | Freq: Once | ORAL | Status: AC
  Administered 2012-08-21: 300 mg via ORAL
  Filled 2012-08-21: qty 1

## 2012-08-21 MED ORDER — FUROSEMIDE 20 MG PO TABS
20.0000 mg | ORAL_TABLET | Freq: Once | ORAL | Status: AC
  Administered 2012-08-21: 20 mg via ORAL
  Filled 2012-08-21: qty 1

## 2012-08-21 NOTE — Progress Notes (Signed)
Subjective: Postpartum Day 1: Cesarean Delivery Patient reports tolerating PO.  No HA, CP/SOB, RUQ pain, vision change.  Feeling much better with improvement in edema.  Well controlled postop pain.  Scant VB.  Foley in.    Objective: Vital signs in last 24 hours: Temp:  [97.6 F (36.4 C)-98.4 F (36.9 C)] 97.9 F (36.6 C) (05/28 0755) Pulse Rate:  [50-78] 78 (05/28 0755) Resp:  [12-26] 18 (05/28 0755) BP: (111-180)/(75-113) 145/84 mmHg (05/28 0755) SpO2:  [94 %-97 %] 96 % (05/27 1800) UOP >100cc/hr  Physical Exam:  General: alert, cooperative and appears stated age CV: RRR Pulm: CTAB Lochia: appropriate Uterine Fundus: firm Incision: pressure dressing c/d DVT Evaluation: No evidence of DVT seen on physical exam. Negative Homan's sign. No cords or calf tenderness. 1+ edema, 1+DTRs, no clonus   Recent Labs  08/19/12 1745 08/20/12 0709  HGB 12.2 12.9  HCT 36.0 38.5    Assessment/Plan: Status post Cesarean section. Doing well postoperatively.  Continue current care. Severe PreE  -Will d/c Mag after 24 hours from delivery   -BPs mostly mild range with rare severe; continue labetalol 300 tid  -Diuresing well  -AM labs pending   Simisola Sandles 08/21/2012, 8:55 AM

## 2012-08-21 NOTE — Progress Notes (Signed)
Clarified with MD that we are d/c Magnesium Sulfate

## 2012-08-21 NOTE — Op Note (Signed)
Laurie Randolph, Laurie Randolph NO.:  [id number]  MEDICAL RECORD NO.:  [id number]  LOCATION:  9152                          FACILITY:  [facility]  PHYSICIAN:  Luisantonio Adinolfi L. Holy B[place]field, M.D.DATE OF BIRTH:  December 21, 1980  DATE OF PROCEDURE:  08/20/2012 DATE OF DISCHARGE:                              OPERATIVE REPORT   PREOPERATIVE DIAGNOSES: 1. Intrauterine pregnancy at 30 weeks and 5 days. 2. Severe preeclampsia 3. Breech presentation.  POSTOPERATIVE DIAGNOSES: 1. Intrauterine pregnancy at 30 weeks and 5 days. 2. Severe preeclampsia 3. Breech presentation. 4. Fibroids and ascites.  PROCEDURE:  Primary low transverse cesarean section.  SURGEON:  Oleda Borski L. Safiya Girdler, MD  ANESTHESIA:  Spinal.  EBL:  700 mL.  COMPLICATIONS:  None.  PATHOLOGY:  Placenta.  DRAINS:  Foley.  PROCEDURE:  The patient was taken to the operating room.  She had been prepped and draped.  After the spinal was inserted, low transverse incision was made, carried down to fascia.  Fascia was scored in midline, extended laterally.  Rectus muscles separated in the midline. The peritoneum was entered bluntly.  Significant amount of ascites is noted probably secondary to severe preeclampsia.  The bladder blade was inserted.  She had multiple fibroids.  She had a large 3.5 cm fibroid that was anterior in the midline.  I decided to make a low transverse incision below this away from the fibroid.  We did this.  There was a significant amount of bleeding when we went in with that but I did enter the uterus with a hemostat.  The baby at this point was vertex presentation and must have flipped at some point.  We delivered the baby quite easily with a female infant.  The cord was clamped and cut.  The baby had Apgars of 7 at 1 minute and 8 at 5 minutes, and the baby was handed to the awaiting neonatal team.  After the cord was clamped, clamped and cut,  the placenta was manually removed, noted to be small, intact, was  sent to Pathology.  The uterus was cleared of all clots and debris.  The uterus was exteriorized.  It was closed in 4 layers using 0 chromic in a running, locked stitch.  Hemostasis was excellent.  The uterus was returned to the abdomen.  Irrigation was performed. Hemostasis was good.  The rectus muscles and the peritoneum was reapproximated using 0 Vicryl.  The fascia was closed using 0 Vicryl, starting each corner meeting in midline.  After irrigation of subcutaneous layer, the skin was closed with staples.  Pressure dressing was applied.  The patient will go to the ICU on the magnesium drip and we will monitor blood pressure accordingly.     Amamda Curbow L. Vincente Poli, M.D.     Florestine Avers  D:  08/20/2012  T:  08/21/2012  Job:  [id number]

## 2012-08-21 NOTE — Progress Notes (Signed)
MD notified of BP and no new orders received. RN will recheck in a little bit to see if it is improved after pain  Medication.

## 2012-08-21 NOTE — Progress Notes (Signed)
Orders received for Labetalol 300 mg po x 1

## 2012-08-22 LAB — COMPREHENSIVE METABOLIC PANEL
ALT: 28 U/L (ref 0–35)
BUN: 21 mg/dL (ref 6–23)
CO2: 24 mEq/L (ref 19–32)
Calcium: 7.7 mg/dL — ABNORMAL LOW (ref 8.4–10.5)
Creatinine, Ser: 0.98 mg/dL (ref 0.50–1.10)
GFR calc Af Amer: 86 mL/min — ABNORMAL LOW (ref >90)
GFR calc non Af Amer: 74 mL/min — ABNORMAL LOW (ref >90)
Glucose, Bld: 77 mg/dL (ref 70–99)
Total Protein: 4.8 g/dL — ABNORMAL LOW (ref 6.0–8.3)

## 2012-08-22 LAB — TYPE AND SCREEN
ABO/RH(D): A POS
Unit division: 0

## 2012-08-22 LAB — CBC
HCT: 30.3 % — ABNORMAL LOW (ref 36.0–46.0)
Hemoglobin: 10.2 g/dL — ABNORMAL LOW (ref 12.0–15.0)
MCH: 29.9 pg (ref 26.0–34.0)
MCHC: 33.7 g/dL (ref 30.0–36.0)
MCV: 88.9 fL (ref 78.0–100.0)
RBC: 3.41 MIL/uL — ABNORMAL LOW (ref 3.87–5.11)

## 2012-08-22 MED ORDER — HYDROCHLOROTHIAZIDE 25 MG PO TABS
25.0000 mg | ORAL_TABLET | Freq: Every day | ORAL | Status: DC
  Administered 2012-08-22: 25 mg via ORAL
  Filled 2012-08-22 (×2): qty 1

## 2012-08-22 NOTE — Progress Notes (Signed)
CSW attempted to meet with MOB to complete assessment for NICU admission, but she was not in her room.  CSW checked at baby's bedside, but MOB was speaking with RN.  CSW to attempt again at a later time.

## 2012-08-22 NOTE — Clinical Social Work Maternal (Signed)
Clinical Social Work Department PSYCHOSOCIAL ASSESSMENT - MATERNAL/CHILD 08/22/2012  Patient:  Laurie Randolph, Laurie Randolph  Account Number:  [id number]  Admit Date:  08/08/2012  Laurie Randolph:   Laurie Randolph    Clinical Social Worker:  Laurie Riding, Laurie Randolph   Date/Time:  08/22/2012 01:30 PM  Date Referred:  08/22/2012   Referral source  NICU     Referred reason  NICU   Other referral source:    I:  FAMILY / HOME ENVIRONMENT Child's legal guardian:  PARENT  Guardian - Randolph Guardian - Age Guardian - Address  Laurie Randolph [address] [place], [place] [address]  Laurie Randolph  same   Other household support members/support persons Other support:   MOB states Laurie Randolph, a friend visiting today, is a great support person and lists her sisters as her other main support people in addition to her husband.    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 resources:  [employer] If [employer] - County:  [place] / Grade:   Maternity Care Coordinator / Child Services Coordination / Early Interventions:  Cultural issues impacting care:   None indicated    III  STRENGTHS Strengths  Adequate Resources  Compliance with medical plan  Supportive family/friends  Understanding of illness   Strength comment:    IV  RISK FACTORS AND CURRENT PROBLEMS Current Problem:  None   Risk Factor & Current Problem Patient Issue Family Issue Risk Factor / Current Problem Comment   N N     V  SOCIAL WORK ASSESSMENT  CSW met with MOB in her third floor room/310 to introduce myself and complete assessment.  MOB had a visitor whom she said we could speak in front of.  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  She appears to be very supportive, as she is taking notes on our discussion and has clearly been  taking notes for MOB prior to now, as she referrenced something they have previously been told.  MOB was quiet, but pleasant and stated this was a good time to talk.  She states her main support people, in addition to her visitor Laurie Randolph, are her sisters and husband.  CSW asked MOB how she is coping with her extended hospitalization, premature delivery, and baby's admission to NICU at this point.  She was quiet and looked very emotional.  She states she thinks she is coping well at this time.  She seemed to have a good understanding of baby's medical situation.  She states she had to quit her job in March because she needed health insurance, which her employer did not provide.  She was a Runner, broadcasting/film/video at [employer].  She states she made to much to qualify for Medicaid, so she quit so she could get Medicaid.  FOB just recently started a job at [employer] and his insurer would not allow her to be added because she was already pregnant and they considered this a pre-existing condition.  She states they do not have baby supplies and seemed concerned about how they would get everything together at home.  CSW informed her of [employer]'s [employer] program and she said she would appreciate any assistance available.  CSW will make referral.  CSW informed MOB of baby's eligibility for SSI.  MOB thought about it for a moment, and her friend recommended that she apply now.  CSW assisted her in completing the paperwork and will submit the application once birth certificate is completed.  CSW discussed common emotions related to the NICU experience as well as what to expect, in general terms, from a NICU admission.  MOB was very engaged and attentive.  CSW explained ongoing support services offered by NICU CSW and asked MOB to call CSW at any time.  She seemed appreciative and agreed.  CSW gave contact infroamtion.   VI SOCIAL WORK PLAN Social Work Plan  Psychosocial Support/Ongoing Assessment  of Needs   Type of pt/family education:   Common emotions related to the NICU experience  Ongoing support services offered by NICU CSW  [employer]  [name]   If child protective services report - county:   If child protective services report - date:   Information/referral to community resources comment:   [employer]  Baby will be referred to [facility] closer to discharge  [employer]   Other social work plan:

## 2012-08-22 NOTE — Progress Notes (Signed)
Subjective: Postpartum Day 3: Cesarean Delivery Patient reports tolerating PO, + flatus and no problems voiding.  Ambulating without difficulty.  Denies HA, n/v, and visual changes.  Baby in NICU with RDS  Objective: Vital signs in last 24 hours: Temp:  [97.8 F (36.6 C)-98.8 F (37.1 C)] 98.2 F (36.8 C) (05/29 0556) Pulse Rate:  [63-77] 75 (05/29 0631) Resp:  [18-20] 18 (05/29 0631) BP: (146-185)/(89-116) 160/95 mmHg (05/29 0631) SpO2:  [90 %-95 %] 95 % (05/29 0556) Weight:  [82.155 kg (181 lb 1.9 oz)] 82.155 kg (181 lb 1.9 oz) (05/29 0836)  Physical Exam:  General: alert and cooperative Lochia: appropriate Uterine Fundus: firm Incision: no significant drainage DVT Evaluation: No evidence of DVT seen on physical exam.   Recent Labs  08/21/12 0720 08/22/12 0530  HGB 11.0* 10.2*  HCT 32.3* 30.3*    Assessment/Plan: Status post Cesarean section. Postoperative course complicated by continued high blood pressures  Labs trending in right direction and UOP good.  Will add HCTZ to HTN mngt Continue labetalol Repeat labs in am  Numa Schroeter 08/22/2012, 8:40 AM

## 2012-08-23 LAB — COMPREHENSIVE METABOLIC PANEL
ALT: 23 U/L (ref 0–35)
Alkaline Phosphatase: 85 U/L (ref 39–117)
Chloride: 103 mEq/L (ref 96–112)
GFR calc Af Amer: >90 mL/min (ref >90)
Glucose, Bld: 80 mg/dL (ref 70–99)
Potassium: 3.8 mEq/L (ref 3.5–5.1)
Sodium: 134 mEq/L — ABNORMAL LOW (ref 135–145)
Total Bilirubin: 0.3 mg/dL (ref 0.3–1.2)
Total Protein: 4.6 g/dL — ABNORMAL LOW (ref 6.0–8.3)

## 2012-08-23 LAB — CBC
Hemoglobin: 9.7 g/dL — ABNORMAL LOW (ref 12.0–15.0)
MCHC: 34.3 g/dL (ref 30.0–36.0)
RBC: 3.18 MIL/uL — ABNORMAL LOW (ref 3.87–5.11)
WBC: 15.2 10*3/uL — ABNORMAL HIGH (ref 4.0–10.5)

## 2012-08-23 MED ORDER — HYDROCHLOROTHIAZIDE 50 MG PO TABS
50.0000 mg | ORAL_TABLET | Freq: Every day | ORAL | Status: DC
  Administered 2012-08-23 – 2012-08-25 (×3): 50 mg via ORAL
  Filled 2012-08-23 (×3): qty 1

## 2012-08-23 MED ORDER — LABETALOL HCL 200 MG PO TABS
400.0000 mg | ORAL_TABLET | Freq: Three times a day (TID) | ORAL | Status: DC
  Administered 2012-08-23 – 2012-08-25 (×7): 400 mg via ORAL
  Filled 2012-08-23 (×7): qty 2

## 2012-08-23 NOTE — Progress Notes (Signed)
Subjective: Postpartum Day 4: Cesarean Delivery Patient reports tolerating PO, + flatus, + BM and no problems voiding.    Objective: Vital signs in last 24 hours: Temp:  [98.1 F (36.7 C)-98.7 F (37.1 C)] 98.1 F (36.7 C) (05/30 0554) Pulse Rate:  [76-83] 78 (05/30 0554) Resp:  [18-20] 18 (05/30 0554) BP: (151-179)/(90-103) 169/96 mmHg (05/30 0554) SpO2:  [95 %-98 %] 96 % (05/30 0554) Weight:  [174 lb 0.1 oz (78.929 kg)] 174 lb 0.1 oz (78.929 kg) (05/30 0554)  Physical Exam:  General: alert and cooperative Lochia: appropriate Uterine Fundus: firm Incision: honeycomb dressing CDI, staples appear to be intact DVT Evaluation: No evidence of DVT seen on physical exam. Negative Homan's sign. Calf/Ankle edema is present.   Recent Labs  08/22/12 0530 08/23/12 0545  HGB 10.2* 9.7*  HCT 30.3* 28.3*    Assessment/Plan: Status post Cesarean section. Postoperative course complicated by HTN  Continue current care.  Jaaliyah Lucatero G 08/23/2012, 8:52 AM

## 2012-08-24 NOTE — Progress Notes (Signed)
Subjective: Postpartum Day 5: Cesarean Delivery Patient reports tolerating PO.  Still c/o LE edema  Objective: Vital signs in last 24 hours: Temp:  [98 F (36.7 C)-98.5 F (36.9 C)] 98.5 F (36.9 C) (05/31 1610) Pulse Rate:  [65-76] 65 (05/31 0608) Resp:  [18-20] 18 (05/31 0608) BP: (158-178)/(92-108) 166/92 mmHg (05/31 0608) SpO2:  [95 %-100 %] 96 % (05/31 0608) Weight:  [77.111 kg (170 lb)] 77.111 kg (170 lb) (05/31 9604)  Physical Exam:  General: alert Lochia: appropriate Uterine Fundus: firm Incision: healing well DVT Evaluation: No evidence of DVT seen on physical exam.   Recent Labs  08/22/12 0530 08/23/12 0545  HGB 10.2* 9.7*  HCT 30.3* 28.3*    Assessment/Plan: Status post Cesarean section. Doing well postoperatively.  Continue current care, plan for D/C in AM.  Laurie Randolph 08/24/2012, 9:23 AM

## 2012-08-25 MED ORDER — SERTRALINE HCL 25 MG PO TABS: 25.0000 mg | ORAL_TABLET | Freq: Every day | ORAL | Status: DC

## 2012-08-25 MED ORDER — OXYCODONE-ACETAMINOPHEN 5-325 MG PO TABS: 1.0000 | ORAL_TABLET | ORAL | Status: DC | PRN

## 2012-08-25 MED ORDER — HYDROCHLOROTHIAZIDE 50 MG PO TABS: 50.0000 mg | ORAL_TABLET | Freq: Every day | ORAL | Status: DC

## 2012-08-25 MED ORDER — IBUPROFEN 200 MG PO TABS: 600.0000 mg | ORAL_TABLET | Freq: Three times a day (TID) | ORAL | Status: DC | PRN

## 2012-08-25 MED ORDER — LABETALOL HCL 100 MG PO TABS: 400.0000 mg | ORAL_TABLET | Freq: Three times a day (TID) | ORAL | Status: DC

## 2012-08-25 NOTE — Progress Notes (Signed)
Subjective: Postpartum Day 6: Cesarean Delivery Patient reports tolerating PO.    Objective: Vital signs in last 24 hours: Temp:  [97.7 F (36.5 C)-98.5 F (36.9 C)] 97.9 F (36.6 C) (06/01 0855) Pulse Rate:  [62-84] 78 (06/01 0855) Resp:  [18-20] 19 (06/01 0855) BP: (139-192)/(74-103) 151/97 mmHg (06/01 0855) SpO2:  [97 %-100 %] 98 % (06/01 0855) Weight:  [162 lb (73.483 kg)] 162 lb (73.483 kg) (06/01 0550)  Physical Exam:  General: alert Lochia: appropriate Uterine Fundus: firm Incision: healing well DVT Evaluation: No evidence of DVT seen on physical exam.   Recent Labs  08/23/12 0545  HGB 9.7*  HCT 28.3*    Assessment/Plan: Status post Cesarean section. Doing well postoperatively.  DCRicharda Overlie M 08/25/2012, 9:07 AM

## 2012-08-25 NOTE — Discharge Summary (Signed)
Obstetric Discharge Summary Reason for Admission: induction of labor Prenatal Procedures: NST and Preeclampsia Intrapartum Procedures: cesarean: low cervical, transverse Postpartum Procedures: none Complications-Operative and Postpartum: hypertension Hemoglobin  Date Value Range Status  08/23/2012 9.7* 12.0 - 15.0 g/dL Final     HCT  Date Value Range Status  08/23/2012 28.3* 36.0 - 46.0 % Final    Physical Exam:  General: alert Lochia: appropriate Uterine Fundus: firm Incision: healing well DVT Evaluation: No evidence of DVT seen on physical exam.  Discharge Diagnoses: Preelampsia and preterm delivery by LTCS  Discharge Information: Date: 08/25/2012 Activity: pelvic rest Diet: routine Medications: PNV, Ibuprofen, Percocet and labetolol + HCTZ+ zoloft Condition: stable Instructions: refer to practice specific booklet Discharge to: home Follow-up Information   Follow up with Meriel Pica, MD In 5 days. (office will call)    Contact information:   [address] [place] [address] [phone]       Newborn Data: Live born female  Birth Weight: 2 lb 8.6 oz (1150 g) APGAR: 7, 8  Home with stable in NICU.  Coree Riester M 08/25/2012, 9:11 AM

## 2012-08-25 NOTE — Progress Notes (Signed)
Discharge instructions provided to patient and significant other at bedside.  Activity, medications, follow up appointments, when to call the doctor and community resources discussed.  No questions at this time.  Patient left unit in stable condition with all personal belongings and prescriptions.  Osvaldo Angst, RN----------------------------

## 2012-08-27 ENCOUNTER — Ambulatory Visit: Payer: Self-pay

## 2012-08-27 NOTE — Lactation Note (Signed)
This note was copied from the chart of Laurie Fleur Audino. Lactation Consultation Note Mom at baby's bedside in NICU; concerned about her milk volume with pumping. Discussed the importance of hand expression, and inst mom to hand express for 3 to 4 minutes after each pumping session.  Mom states she is already taking Fenugreek. Recommended lactation cookies; recipe given. Offered encouragement to mom and enc rest and to take care of her hydration and nutritional needs. Questions answered.   Patient Name: Laurie Randolph Date: 08/27/2012     Maternal Data    Feeding Feeding Type: Other (comment) (NPO)  LATCH Score/Interventions                      Lactation Tools Discussed/Used     Consult Status      Lenard Forth 08/27/2012, 11:41 AM

## 2012-11-12 ENCOUNTER — Ambulatory Visit: Payer: Self-pay

## 2012-11-12 NOTE — Lactation Note (Signed)
This note was copied from the chart of Laurie Randolph. Lactation Consultation Note     Follow up consult with mom and Laurie Randolph. Laurie Randolph has an oral aversion, and the plan today was try 24 hours of breast feeding. Laurie Randolph would only latch to nipple, and cried and pulled away from breast when the breast was advanced further into her mouth. A 20 nipple shield was tried, and she  suckled on and off a few times, so I tried to use an SNS under the shield, but she would not relatch to shield again - very fussy, upset.   I then did some finger suck gentle stimulation with baby, and she responded well - wide mouth, rooting, content. I then tried the SNS with a gloved finger, and she fed well for about 15 minutes, and took 10 mls.   After 15 minutes, she was fussy so mom burped her, but she would not consistently take the finger feeding again. This "feeding" lasted and hour, and at this point Laurie Randolph was asleep. Becky Maddox . PT. Was present during the last 30 minutes, when Laurie Randolph refused the finger feeding. She spoke to mom about the benefits of a Gt with a baby like Laurie Randolph. Mom tolerated this all very well, but appeared very tired after this. As per Dr. Joana Reamer, an ng tube was passed, and Laurie Randolph was fed by ng, and additional 50 mls over 90 minutes. She had not fed  In 6 hours at this point. I taught mom how to use the SNS and care for it, and how to finger feed. Mom and dad will be staying on the unit in room 209  Tonight, for mom to continue to feed Laurie Randolph. Only breast milk is to be fed for 24 hours, to see if this helps with Laurie Randolph's food tolerance. I will follow up with mom tomorrow.  Patient Name: Laurie Randolph [id number] Date: 11/12/2012 Reason for consult: Follow-up assessment;NICU baby   Maternal Data    Feeding Feeding Type: Breast Milk Length of feed: 90 min  LATCH Score/Interventions Latch: Too sleepy or reluctant, no latch achieved, no sucking elicited. (Laurie Randolph was very unhappy with deep  latch - wanted to just ni) Intervention(s): Skin to skin Intervention(s): Adjust position;Assist with latch;Breast compression;Breast massage  Audible Swallowing: None  Type of Nipple: Everted at rest and after stimulation (20 nipple shiled  - some suckles on and off)  Comfort (Breast/Nipple): Soft / non-tender     Hold (Positioning): Assistance needed to correctly position infant at breast and maintain latch. Intervention(s): Breastfeeding basics reviewed;Support Pillows;Position options  LATCH Score: 5  Lactation Tools Discussed/Used Tools: Nipple Dorris Carnes;Supplemental Nutrition System Nipple shield size: 20   Consult Status Consult Status: Follow-up Date: 11/13/12 Follow-up type: In-patient    Alfred Levins 11/12/2012, 4:06 PM

## 2012-11-13 ENCOUNTER — Ambulatory Visit: Payer: Self-pay

## 2012-11-13 NOTE — Lactation Note (Signed)
This note was copied from the chart of Laurie Sylvan Lahm. Lactation Consultation Note    Follow up consult with this mom. Laurie Randolph is being transferred to [facility] today, to be evaluated for a gastrostomy tube. Mom is ware she can still call for outpatient lactation assistance in the future , as needed.   Patient Name: Laurie Randolph [id number] Date: 11/13/2012 Reason for consult: Follow-up assessment;NICU baby   Maternal Data    Feeding    LATCH Score/Interventions                      Lactation Tools Discussed/Used     Consult Status Consult Status: Complete Follow-up type: Call as needed    Alfred Levins 11/13/2012, 1:05 PM

## 2013-03-31 ENCOUNTER — Ambulatory Visit (INDEPENDENT_AMBULATORY_CARE_PROVIDER_SITE_OTHER): Payer: Self-pay | Admitting: Surgery

## 2013-04-25 ENCOUNTER — Encounter (INDEPENDENT_AMBULATORY_CARE_PROVIDER_SITE_OTHER): Payer: Self-pay | Admitting: Surgery

## 2013-04-25 ENCOUNTER — Ambulatory Visit (INDEPENDENT_AMBULATORY_CARE_PROVIDER_SITE_OTHER): Payer: Self-pay | Admitting: Surgery

## 2013-04-25 VITALS — BP 110/88 | HR 72 | Temp 98.7°F | Resp 15 | Ht 64.0 in | Wt 151.0 lb

## 2013-04-25 DIAGNOSIS — K4090 Unilateral inguinal hernia, without obstruction or gangrene, not specified as recurrent: Secondary | ICD-10-CM | POA: Insufficient documentation
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
## 2013-04-25 NOTE — Patient Instructions (Signed)

## 2013-09-22 ENCOUNTER — Ambulatory Visit (INDEPENDENT_AMBULATORY_CARE_PROVIDER_SITE_OTHER): Payer: BC Managed Care – PPO | Admitting: Surgery

## 2013-09-22 ENCOUNTER — Encounter (INDEPENDENT_AMBULATORY_CARE_PROVIDER_SITE_OTHER): Payer: Self-pay | Admitting: Surgery

## 2013-09-22 VITALS — BP 110/80 | HR 64 | Temp 97.1°F | Resp 14 | Ht 64.0 in | Wt 154.4 lb

## 2013-09-22 DIAGNOSIS — K4090 Unilateral inguinal hernia, without obstruction or gangrene, not specified as recurrent: Secondary | ICD-10-CM
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
## 2013-09-22 NOTE — Patient Instructions (Signed)
Inguinal Hernia, Adult °Muscles help keep everything in the body in its proper place. But if a weak spot in the muscles develops, something can poke through. That is called a hernia. When this happens in the lower part of the belly (abdomen), it is called an inguinal hernia. (It takes its name from a part of the body in this region called the inguinal canal.) A weak spot in the wall of muscles lets some fat or part of the small intestine bulge through. An inguinal hernia can develop at any age. Men get them more often than women. °CAUSES  °In adults, an inguinal hernia develops over time. °· It can be triggered by: °¨ Suddenly straining the muscles of the lower abdomen. °¨ Lifting heavy objects. °¨ Straining to have a bowel movement. Difficult bowel movements (constipation) can lead to this. °¨ Constant coughing. This may be caused by smoking or lung disease. °¨ Being overweight. °¨ Being pregnant. °¨ Working at a job that requires long periods of standing or heavy lifting. °¨ Having had an inguinal hernia before. °One type can be an emergency situation. It is called a strangulated inguinal hernia. It develops if part of the small intestine slips through the weak spot and cannot get back into the abdomen. The blood supply can be cut off. If that happens, part of the intestine may die. This situation requires emergency surgery. °SYMPTOMS  °Often, a small inguinal hernia has no symptoms. It is found when a healthcare Harris Penton does a physical exam. Larger hernias usually have symptoms.  °· In adults, symptoms may include: °¨ A lump in the groin. This is easier to see when the person is standing. It might disappear when lying down. °¨ In men, a lump in the scrotum. °¨ Pain or burning in the groin. This occurs especially when lifting, straining or coughing. °¨ A dull ache or feeling of pressure in the groin. °· Signs of a strangulated hernia can include: °¨ A bulge in the groin that becomes very painful and tender to the  touch. °¨ A bulge that turns red or purple. °¨ Fever, nausea and vomiting. °¨ Inability to have a bowel movement or to pass gas. °DIAGNOSIS  °To decide if you have an inguinal hernia, a healthcare Daliyah Sramek will probably do a physical examination. °· This will include asking questions about any symptoms you have noticed. °· The healthcare Cyann Venti might feel the groin area and ask you to cough. If an inguinal hernia is felt, the healthcare Batu Cassin may try to slide it back into the abdomen. °· Usually no other tests are needed. °TREATMENT  °Treatments can vary. The size of the hernia makes a difference. Options include: °· Watchful waiting. This is often suggested if the hernia is small and you have had no symptoms. °¨ No medical procedure will be done unless symptoms develop. °¨ You will need to watch closely for symptoms. If any occur, contact your healthcare Ariyel Jeangilles right away. °· Surgery. This is used if the hernia is larger or you have symptoms. °¨ Open surgery. This is usually an outpatient procedure (you will not stay overnight in a hospital). An cut (incision) is made through the skin in the groin. The hernia is put back inside the abdomen. The weak area in the muscles is then repaired by herniorrhaphy or hernioplasty. Herniorrhaphy: in this type of surgery, the weak muscles are sewn back together. Hernioplasty: a patch or mesh is used to close the weak area in the abdominal wall. °¨ Laparoscopy.   In this procedure, a surgeon makes small incisions. A thin tube with a tiny video camera (called a laparoscope) is put into the abdomen. The surgeon repairs the hernia with mesh by looking with the video camera and using two long instruments. °HOME CARE INSTRUCTIONS  °· After surgery to repair an inguinal hernia: °¨ You will need to take pain medicine prescribed by your healthcare Aidynn Krenn. Follow all directions carefully. °¨ You will need to take care of the wound from the incision. °¨ Your activity will be  restricted for awhile. This will probably include no heavy lifting for several weeks. You also should not do anything too active for a few weeks. When you can return to work will depend on the type of job that you have. °· During "watchful waiting" periods, you should: °¨ Maintain a healthy weight. °¨ Eat a diet high in fiber (fruits, vegetables and whole grains). °¨ Drink plenty of fluids to avoid constipation. This means drinking enough water and other liquids to keep your urine clear or pale yellow. °¨ Do not lift heavy objects. °¨ Do not stand for long periods of time. °¨ Quit smoking. This should keep you from developing a frequent cough. °SEEK MEDICAL CARE IF:  °· A bulge develops in your groin area. °· You feel pain, a burning sensation or pressure in the groin. This might be worse if you are lifting or straining. °· You develop a fever of more than 100.5° F (38.1° C). °SEEK IMMEDIATE MEDICAL CARE IF:  °· Pain in the groin increases suddenly. °· A bulge in the groin gets bigger suddenly and does not go down. °· For men, there is sudden pain in the scrotum. Or, the size of the scrotum increases. °· A bulge in the groin area becomes red or purple and is painful to touch. °· You have nausea or vomiting that does not go away. °· You feel your heart beating much faster than normal. °· You cannot have a bowel movement or pass gas. °· You develop a fever of more than 102.0° F (38.9° C). °Document Released: 07/30/2008 Document Revised: 06/05/2011 Document Reviewed: 07/30/2008 °ExitCare® Patient Information ©2015 [employer]. This information is not intended to replace advice given to you by your health care Valrie Jia. Make sure you discuss any questions you have with your health care Deseray Daponte. ° °

## 2013-09-24 DIAGNOSIS — K4090 Unilateral inguinal hernia, without obstruction or gangrene, not specified as recurrent: Secondary | ICD-10-CM

## 2013-09-24 HISTORY — DX: Unilateral inguinal hernia, without obstruction or gangrene, not specified as recurrent: K40.90

## 2013-10-03 ENCOUNTER — Encounter (HOSPITAL_BASED_OUTPATIENT_CLINIC_OR_DEPARTMENT_OTHER): Payer: Self-pay | Admitting: *Deleted

## 2013-10-09 ENCOUNTER — Ambulatory Visit (HOSPITAL_BASED_OUTPATIENT_CLINIC_OR_DEPARTMENT_OTHER): Payer: BC Managed Care – PPO | Admitting: Anesthesiology

## 2013-10-09 ENCOUNTER — Encounter (HOSPITAL_BASED_OUTPATIENT_CLINIC_OR_DEPARTMENT_OTHER): Payer: BC Managed Care – PPO | Admitting: Anesthesiology

## 2013-10-09 ENCOUNTER — Encounter (HOSPITAL_BASED_OUTPATIENT_CLINIC_OR_DEPARTMENT_OTHER): Payer: Self-pay | Admitting: Anesthesiology

## 2013-10-09 ENCOUNTER — Ambulatory Visit (HOSPITAL_BASED_OUTPATIENT_CLINIC_OR_DEPARTMENT_OTHER)
Admission: RE | Admit: 2013-10-09 | Discharge: 2013-10-09 | Disposition: A | Discharge status: Home / Self Care | Payer: BC Managed Care – PPO | Source: Ambulatory Visit | Attending: Surgery | Admitting: Surgery

## 2013-10-09 ENCOUNTER — Encounter (HOSPITAL_BASED_OUTPATIENT_CLINIC_OR_DEPARTMENT_OTHER)
Admission: RE | Disposition: A | Discharge status: Home / Self Care | Payer: Self-pay | Source: Ambulatory Visit | Attending: Surgery

## 2013-10-09 DIAGNOSIS — Z880 Allergy status to penicillin: Secondary | ICD-10-CM | POA: Insufficient documentation

## 2013-10-09 DIAGNOSIS — F1010 Alcohol abuse, uncomplicated: Secondary | ICD-10-CM | POA: Insufficient documentation

## 2013-10-09 DIAGNOSIS — J45909 Unspecified asthma, uncomplicated: Secondary | ICD-10-CM | POA: Insufficient documentation

## 2013-10-09 DIAGNOSIS — I059 Rheumatic mitral valve disease, unspecified: Secondary | ICD-10-CM | POA: Insufficient documentation

## 2013-10-09 DIAGNOSIS — E785 Hyperlipidemia, unspecified: Secondary | ICD-10-CM | POA: Insufficient documentation

## 2013-10-09 DIAGNOSIS — Z79899 Other long term (current) drug therapy: Secondary | ICD-10-CM | POA: Insufficient documentation

## 2013-10-09 DIAGNOSIS — K4090 Unilateral inguinal hernia, without obstruction or gangrene, not specified as recurrent: Secondary | ICD-10-CM | POA: Insufficient documentation

## 2013-10-09 HISTORY — PX: INSERTION OF MESH: SHX5868

## 2013-10-09 HISTORY — DX: Nonrheumatic mitral (valve) prolapse: I34.1

## 2013-10-09 HISTORY — DX: Dental restoration status: Z98.811

## 2013-10-09 HISTORY — DX: Anxiety disorder, unspecified: F41.9

## 2013-10-09 HISTORY — PX: INGUINAL HERNIA REPAIR: SHX194

## 2013-10-09 HISTORY — DX: Unilateral inguinal hernia, without obstruction or gangrene, not specified as recurrent: K40.90

## 2013-10-09 LAB — POCT HEMOGLOBIN-HEMACUE: HEMOGLOBIN: 14.4 g/dL (ref 12.0–15.0)

## 2013-10-09 SURGERY — REPAIR, HERNIA, INGUINAL, ADULT
Anesthesia: General | Site: Groin | Laterality: Right

## 2013-10-09 MED ORDER — OXYCODONE HCL 5 MG/5ML PO SOLN: 5.0000 mg | Freq: Once | ORAL | Status: AC | PRN

## 2013-10-09 MED ORDER — OXYCODONE HCL 5 MG PO TABS
ORAL_TABLET | ORAL | Status: AC
  Filled 2013-10-09: qty 1

## 2013-10-09 MED ORDER — FENTANYL CITRATE 0.05 MG/ML IJ SOLN
INTRAMUSCULAR | Status: AC
  Filled 2013-10-09: qty 2

## 2013-10-09 MED ORDER — LIDOCAINE HCL (CARDIAC) 20 MG/ML IV SOLN
INTRAVENOUS | Status: DC | PRN
  Administered 2013-10-09: 50 mg via INTRAVENOUS

## 2013-10-09 MED ORDER — BUPIVACAINE-EPINEPHRINE 0.25% -1:200000 IJ SOLN
INTRAMUSCULAR | Status: DC | PRN
  Administered 2013-10-09: 5 mL

## 2013-10-09 MED ORDER — FENTANYL CITRATE 0.05 MG/ML IJ SOLN
INTRAMUSCULAR | Status: DC | PRN
  Administered 2013-10-09: 50 ug via INTRAVENOUS

## 2013-10-09 MED ORDER — HYDROMORPHONE HCL PF 1 MG/ML IJ SOLN
INTRAMUSCULAR | Status: AC
  Filled 2013-10-09: qty 1

## 2013-10-09 MED ORDER — METOCLOPRAMIDE HCL 5 MG/ML IJ SOLN: 10.0000 mg | Freq: Once | INTRAMUSCULAR | Status: DC | PRN

## 2013-10-09 MED ORDER — PROPOFOL 10 MG/ML IV BOLUS
INTRAVENOUS | Status: DC | PRN
  Administered 2013-10-09: 200 mg via INTRAVENOUS

## 2013-10-09 MED ORDER — HYDROMORPHONE HCL PF 1 MG/ML IJ SOLN
0.2500 mg | INTRAMUSCULAR | Status: DC | PRN
  Administered 2013-10-09: 0.5 mg via INTRAVENOUS

## 2013-10-09 MED ORDER — FENTANYL CITRATE 0.05 MG/ML IJ SOLN
50.0000 ug | INTRAMUSCULAR | Status: DC | PRN
  Administered 2013-10-09: 100 ug via INTRAVENOUS

## 2013-10-09 MED ORDER — DEXAMETHASONE SODIUM PHOSPHATE 4 MG/ML IJ SOLN
INTRAMUSCULAR | Status: DC | PRN
  Administered 2013-10-09: 10 mg via INTRAVENOUS

## 2013-10-09 MED ORDER — EPHEDRINE SULFATE 50 MG/ML IJ SOLN
INTRAMUSCULAR | Status: DC | PRN
  Administered 2013-10-09: 10 mg via INTRAVENOUS

## 2013-10-09 MED ORDER — MIDAZOLAM HCL 2 MG/2ML IJ SOLN
INTRAMUSCULAR | Status: AC
  Filled 2013-10-09: qty 2

## 2013-10-09 MED ORDER — CHLORHEXIDINE GLUCONATE 4 % EX LIQD: 1.0000 "application " | Freq: Once | CUTANEOUS | Status: DC

## 2013-10-09 MED ORDER — CLINDAMYCIN PHOSPHATE 300 MG/50ML IV SOLN
300.0000 mg | Freq: Once | INTRAVENOUS | Status: AC
  Administered 2013-10-09: 300 mg via INTRAVENOUS

## 2013-10-09 MED ORDER — OXYCODONE HCL 5 MG PO TABS
5.0000 mg | ORAL_TABLET | Freq: Once | ORAL | Status: AC | PRN
  Administered 2013-10-09: 5 mg via ORAL

## 2013-10-09 MED ORDER — BUPIVACAINE HCL (PF) 0.5 % IJ SOLN
INTRAMUSCULAR | Status: DC | PRN
  Administered 2013-10-09: 20 mL

## 2013-10-09 MED ORDER — TRAMADOL HCL 50 MG PO TABS: 50.0000 mg | ORAL_TABLET | Freq: Four times a day (QID) | ORAL | Status: DC | PRN

## 2013-10-09 MED ORDER — FENTANYL CITRATE 0.05 MG/ML IJ SOLN
INTRAMUSCULAR | Status: AC
  Filled 2013-10-09: qty 6

## 2013-10-09 MED ORDER — MIDAZOLAM HCL 2 MG/2ML IJ SOLN
1.0000 mg | INTRAMUSCULAR | Status: DC | PRN
  Administered 2013-10-09: 2 mg via INTRAVENOUS

## 2013-10-09 MED ORDER — CLINDAMYCIN PHOSPHATE 300 MG/50ML IV SOLN
INTRAVENOUS | Status: AC
  Filled 2013-10-09: qty 50

## 2013-10-09 MED ORDER — ONDANSETRON HCL 4 MG/2ML IJ SOLN
INTRAMUSCULAR | Status: DC | PRN
  Administered 2013-10-09: 4 mg via INTRAVENOUS

## 2013-10-09 MED ORDER — BUPIVACAINE-EPINEPHRINE (PF) 0.25% -1:200000 IJ SOLN
INTRAMUSCULAR | Status: AC
  Filled 2013-10-09: qty 30

## 2013-10-09 MED ORDER — OXYCODONE-ACETAMINOPHEN 5-325 MG PO TABS: 1.0000 | ORAL_TABLET | ORAL | Status: DC | PRN

## 2013-10-09 MED ORDER — LACTATED RINGERS IV SOLN
INTRAVENOUS | Status: DC
  Administered 2013-10-09: 12:00:00 via INTRAVENOUS

## 2013-10-09 MED ORDER — MIDAZOLAM HCL 2 MG/ML PO SYRP: 12.0000 mg | ORAL_SOLUTION | Freq: Once | ORAL | Status: DC | PRN

## 2013-10-09 SURGICAL SUPPLY — 55 items
ADH SKN CLS APL DERMABOND .7 (GAUZE/BANDAGES/DRESSINGS) ×1
BLADE SURG 15 STRL LF DISP TIS (BLADE) ×1 IMPLANT
BLADE SURG 15 STRL SS (BLADE) ×3
BLADE SURG ROTATE 9660 (MISCELLANEOUS) IMPLANT
CANISTER SUCT 1200ML W/VALVE (MISCELLANEOUS) ×1 IMPLANT
CHLORAPREP W/TINT 26ML (MISCELLANEOUS) ×3 IMPLANT
COVER MAYO STAND STRL (DRAPES) ×3 IMPLANT
COVER TABLE BACK 60X90 (DRAPES) ×3 IMPLANT
DECANTER SPIKE VIAL GLASS SM (MISCELLANEOUS) ×3 IMPLANT
DERMABOND ADVANCED (GAUZE/BANDAGES/DRESSINGS) ×2
DERMABOND ADVANCED .7 DNX12 (GAUZE/BANDAGES/DRESSINGS) ×1 IMPLANT
DRAIN PENROSE 1/2X12 LTX STRL (WOUND CARE) ×1 IMPLANT
DRAPE LAPAROTOMY TRNSV 102X78 (DRAPE) ×3 IMPLANT
DRAPE UTILITY XL STRL (DRAPES) ×3 IMPLANT
ELECT COATED BLADE 2.86 ST (ELECTRODE) ×3 IMPLANT
ELECT REM PT RETURN 9FT ADLT (ELECTROSURGICAL) ×3
ELECTRODE REM PT RTRN 9FT ADLT (ELECTROSURGICAL) ×1 IMPLANT
GAUZE SPONGE 4X4 16PLY XRAY LF (GAUZE/BANDAGES/DRESSINGS) IMPLANT
GLOVE BIOGEL PI IND STRL 7.0 (GLOVE) IMPLANT
GLOVE BIOGEL PI IND STRL 8 (GLOVE) ×1 IMPLANT
GLOVE BIOGEL PI INDICATOR 7.0 (GLOVE) ×4
GLOVE BIOGEL PI INDICATOR 8 (GLOVE) ×2
GLOVE ECLIPSE 7.0 STRL STRAW (GLOVE) ×2 IMPLANT
GLOVE ECLIPSE 8.0 STRL XLNG CF (GLOVE) ×3 IMPLANT
GLOVE SURG SS PI 7.0 STRL IVOR (GLOVE) ×2 IMPLANT
GOWN STRL REUS W/ TWL LRG LVL3 (GOWN DISPOSABLE) ×2 IMPLANT
GOWN STRL REUS W/TWL LRG LVL3 (GOWN DISPOSABLE) ×6
MESH HERNIA SYS ULTRAPRO LRG (Mesh General) ×2 IMPLANT
NDL HYPO 25X1 1.5 SAFETY (NEEDLE) ×1 IMPLANT
NEEDLE HYPO 25X1 1.5 SAFETY (NEEDLE) ×3 IMPLANT
NS IRRIG 1000ML POUR BTL (IV SOLUTION) ×2 IMPLANT
PACK BASIN DAY SURGERY FS (CUSTOM PROCEDURE TRAY) ×3 IMPLANT
PENCIL BUTTON HOLSTER BLD 10FT (ELECTRODE) ×3 IMPLANT
SLEEVE SCD COMPRESS KNEE MED (MISCELLANEOUS) ×3 IMPLANT
SPONGE GAUZE 4X4 12PLY STER LF (GAUZE/BANDAGES/DRESSINGS) IMPLANT
SPONGE LAP 4X18 X RAY DECT (DISPOSABLE) ×3 IMPLANT
STAPLER VISISTAT 35W (STAPLE) IMPLANT
SUT MON AB 4-0 PC3 18 (SUTURE) ×3 IMPLANT
SUT NOVA 0 T19/GS 22DT (SUTURE) ×6 IMPLANT
SUT VIC AB 0 SH 27 (SUTURE) ×3 IMPLANT
SUT VIC AB 2-0 SH 27 (SUTURE) ×3
SUT VIC AB 2-0 SH 27XBRD (SUTURE) ×1 IMPLANT
SUT VIC AB 3-0 54X BRD REEL (SUTURE) IMPLANT
SUT VIC AB 3-0 BRD 54 (SUTURE)
SUT VICRYL 3-0 CR8 SH (SUTURE) ×3 IMPLANT
SUT VICRYL AB 2 0 TIE (SUTURE) IMPLANT
SUT VICRYL AB 2 0 TIES (SUTURE)
SYRINGE CONTROL L 12CC (SYRINGE) ×3 IMPLANT
SYRINGE CONTROL LL 12CC (SYRINGE) ×1 IMPLANT
TAPE HYPAFIX 4 X10 (GAUZE/BANDAGES/DRESSINGS) IMPLANT
TOWEL OR 17X24 6PK STRL BLUE (TOWEL DISPOSABLE) ×6 IMPLANT
TOWEL OR NON WOVEN STRL DISP B (DISPOSABLE) ×3 IMPLANT
TUBE CONNECTING 20'X1/4 (TUBING)
TUBE CONNECTING 20X1/4 (TUBING) ×1 IMPLANT
YANKAUER SUCT BULB TIP NO VENT (SUCTIONS) ×1 IMPLANT
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
## 2013-10-09 NOTE — Interval H&P Note (Signed)
History and Physical Interval Note:  10/09/2013 12:17 PM  Laurie Randolph  has presented today for surgery, with the diagnosis of hernia  The various methods of treatment have been discussed with the patient and family. After consideration of risks, benefits and other options for treatment, the patient has consented to  Procedure(s): HERNIA REPAIR INGUINAL ADULT (Right) INSERTION OF MESH (Right) as a surgical intervention .  The patient's history has been reviewed, patient examined, no change in status, stable for surgery.  I have reviewed the patient's chart and labs.  Questions were answered to the patient's satisfaction.     Myquan Schaumburg A.

## 2013-10-09 NOTE — Transfer of Care (Signed)
Immediate Anesthesia Transfer of Care Note  Patient: Laurie Randolph  Procedure(s) Performed: Procedure(s): HERNIA REPAIR INGUINAL ADULT (Right) INSERTION OF MESH (Right)  Patient Location: PACU  Anesthesia Type:General  Level of Consciousness: awake, alert  and oriented  Airway & Oxygen Therapy: Patient Spontanous Breathing and Patient connected to face mask oxygen  Post-op Assessment: Report given to PACU RN and Post -op Vital signs reviewed and stable  Post vital signs: Reviewed and stable  Complications: No apparent anesthesia complications

## 2013-10-09 NOTE — Anesthesia Preprocedure Evaluation (Signed)
Anesthesia Evaluation  Patient identified by MRN, date of birth, ID band Patient awake    Reviewed: Allergy & Precautions, H&P , NPO status , Patient's Chart, lab work & pertinent test results, reviewed documented beta blocker date and time   Airway Mallampati: II TM Distance: >3 FB Neck ROM: full    Dental   Pulmonary asthma ,  breath sounds clear to auscultation        Cardiovascular negative cardio ROS  Rhythm:regular     Neuro/Psych negative neurological ROS  negative psych ROS   GI/Hepatic negative GI ROS, Neg liver ROS,   Endo/Other  negative endocrine ROS  Renal/GU negative Renal ROS  negative genitourinary   Musculoskeletal   Abdominal   Peds  Hematology negative hematology ROS (+)   Anesthesia Other Findings See surgeon's H&P   Reproductive/Obstetrics negative OB ROS                           Anesthesia Physical Anesthesia Plan  ASA: II  Anesthesia Plan: General   Post-op Pain Management:    Induction: Intravenous  Airway Management Planned: LMA  Additional Equipment:   Intra-op Plan:   Post-operative Plan:   Informed Consent: I have reviewed the patients History and Physical, chart, labs and discussed the procedure including the risks, benefits and alternatives for the proposed anesthesia with the patient or authorized representative who has indicated his/her understanding and acceptance.   Dental Advisory Given  Plan Discussed with: CRNA and Surgeon  Anesthesia Plan Comments:         Anesthesia Quick Evaluation

## 2013-10-09 NOTE — Anesthesia Postprocedure Evaluation (Signed)
Anesthesia Post Note  Patient: Laurie Randolph  Procedure(s) Performed: Procedure(s) (LRB): HERNIA REPAIR INGUINAL ADULT (Right) INSERTION OF MESH (Right)  Anesthesia type: General  Patient location: PACU  Post pain: Pain level controlled  Post assessment: Patient's Cardiovascular Status Stable  Last Vitals:  Filed Vitals:   10/09/13 1415  BP: 117/69  Pulse: 73  Temp:   Resp: 17    Post vital signs: Reviewed and stable  Level of consciousness: alert  Complications: No apparent anesthesia complications

## 2013-10-09 NOTE — Op Note (Signed)
Right Inguinal Hernia repair with UHS , Open, Procedure Note  Indications: The patient presented with a history of a right, reducible inguinal hernia hernia.    Pre-operative Diagnosis: right inguinal  reducible  Post-operative Diagnosis: same  Surgeon: Erroll Luna A.   Assistants: OR staff  Anesthesia: General LMA anesthesia and TEP block and 0.25% Marcaine with epinephrine  ASA Class: 1  Procedure Details  The patient was seen again in the Holding Room. The risks, benefits, complications, treatment options, and expected outcomes were discussed with the patient. The possibilities of reaction to medication, pulmonary aspiration, perforation of viscus, bleeding, recurrent infection, the need for additional procedures, and development of a complication requiring transfusion or further operation were discussed with the patient and/or family. There was concurrence with the proposed plan, and informed consent was obtained. The site of surgery was properly noted/marked. The patient was taken to the Operating Room, identified as Laurie Randolph, and the procedure verified as hernia repair. A Time Out was held and the above information confirmed.  The patient was placed in the supine position and underwent induction of anesthesia, the lower abdomen and groin was prepped and draped in the standard fashion, and 0.25% Marcaine with epinephrine was used to anesthetize the skin over the mid-portion of the inguinal canal. A transverse incision was made. Dissection was carried through the soft tissue to expose the inguinal canal and inguinal ligament along its lower edge. The external oblique fascia was split along the course of its fibers, exposing the inguinal canal. The round ligament and nerve were identified and reflected out of the field. The direct  defect was exposed and a piece of prolene hernia system ultrapro mesh was and placed into  the defect and deployed. Interupted  0 Vicryl and 2-0 novafil  suture was then used  to repair the defect, with the suture being sewn from the pubic tubercle inferiorly and superiorly along the canal to a level just beyond the internal ring. Ilioinguinal nerve divided secondary to impingement from the  mesh as it exited the muscle. The contents were then returned to canal and the external oblique fashion was then closed in a continuous fashion using 3-0 Vicryl suture taking care not to cause entrapment. Scarpa's layer closed with 3 0 vicryl and 4 0 monocryl used to close the skin.  Dermabond used for dressing.  Instrument, sponge, and needle counts were correct prior to closure and at the conclusion of the case.  Findings: Hernia as above  Estimated Blood Loss: Minimal         Drains: None         Total IV Fluids: 600 mL         Specimens: none               Complications: None; patient tolerated the procedure well.         Disposition: PACU - hemodynamically stable.         Condition: stable

## 2013-10-09 NOTE — Discharge Instructions (Signed)
[facility] _______[facility] ° °UMBILICAL OR INGUINAL HERNIA REPAIR: POST OP INSTRUCTIONS ° °Always review your discharge instruction sheet given to you by the facility where your surgery was performed. °IF YOU HAVE DISABILITY OR FAMILY LEAVE FORMS, YOU MUST BRING THEM TO THE OFFICE FOR PROCESSING.   °DO NOT GIVE THEM TO YOUR DOCTOR. ° °1. A  prescription for pain medication may be given to you upon discharge.  Take your pain medication as prescribed, if needed.  If narcotic pain medicine is not needed, then you may take acetaminophen (Tylenol) or ibuprofen (Advil) as needed. °2. Take your usually prescribed medications unless otherwise directed. °3. If you need a refill on your pain medication, please contact your pharmacy.  They will contact our office to request authorization. Prescriptions will not be filled after 5 pm or on week-ends. °4. You should follow a light diet the first 24 hours after arrival home, such as soup and crackers, etc.  Be sure to include lots of fluids daily.  Resume your normal diet the day after surgery. °5. Most patients will experience some swelling and bruising around the umbilicus or in the groin and scrotum.  Ice packs and reclining will help.  Swelling and bruising can take several days to resolve.  °6. It is common to experience some constipation if taking pain medication after surgery.  Increasing fluid intake and taking a stool softener (such as Colace) will usually help or prevent this problem from occurring.  A mild laxative (Milk of Magnesia or Miralax) should be taken according to package directions if there are no bowel movements after 48 hours. °7. Unless discharge instructions indicate otherwise, you may remove your bandages 24-48 hours after surgery, and you may shower at that time.  You may have steri-strips (small skin tapes) in place directly over the incision.  These strips should be left on the skin for 7-10 days.  If your surgeon used skin glue on the  incision, you may shower in 24 hours.  The glue will flake off over the next 2-3 weeks.  Any sutures or staples will be removed at the office during your follow-up visit. °8. ACTIVITIES:  You may resume regular (light) daily activities beginning the next day--such as daily self-care, walking, climbing stairs--gradually increasing activities as tolerated.  You may have sexual intercourse when it is comfortable.  Refrain from any heavy lifting or straining until approved by your doctor. °a. You may drive when you are no longer taking prescription pain medication, you can comfortably wear a seatbelt, and you can safely maneuver your car and apply brakes. °b. RETURN TO WORK:  __________________________________________________________ °9. You should see your doctor in the office for a follow-up appointment approximately 2-3 weeks after your surgery.  Make sure that you call for this appointment within a day or two after you arrive home to insure a convenient appointment time. °10. OTHER INSTRUCTIONS:  __________________________________________________________________________________________________________________________________________________________________________________________  °WHEN TO CALL YOUR DOCTOR: °1. Fever over 101.0 °2. Inability to urinate °3. Nausea and/or vomiting °4. Extreme swelling or bruising °5. Continued bleeding from incision. °6. Increased pain, redness, or drainage from the incision ° °The clinic staff is available to answer your questions during regular business hours.  Please don’t hesitate to call and ask to speak to one of the nurses for clinical concerns.  If you have a medical emergency, go to the nearest emergency room or call 911.  A surgeon from [facility] is always on call at the hospital ° ° °  [address], [address], [place], [place]  [address] ? ° [address] [address], [place], [place]   [address] °[phone] ? [phone] ? FAX [phone] °Web site:  [email or web address] ° ° ° °Post Anesthesia Home Care Instructions ° °Activity: °Get plenty of rest for the remainder of the day. A responsible adult should stay with you for 24 hours following the procedure.  °For the next 24 hours, DO NOT: °-Drive a car °-Operate machinery °-Drink alcoholic beverages °-Take any medication unless instructed by your physician °-Make any legal decisions or sign important papers. ° °Meals: °Start with liquid foods such as gelatin or soup. Progress to regular foods as tolerated. Avoid greasy, spicy, heavy foods. If nausea and/or vomiting occur, drink only clear liquids until the nausea and/or vomiting subsides. Call your physician if vomiting continues. ° °Special Instructions/Symptoms: °Your throat may feel dry or sore from the anesthesia or the breathing tube placed in your throat during surgery. If this causes discomfort, gargle with warm salt water. The discomfort should disappear within 24 hours. ° °

## 2013-10-09 NOTE — Progress Notes (Signed)
Assisted Dr. Albertina Parr with right, ultrasound guided, transabdominal plane block. Side rails up, monitors on throughout procedure. See vital signs in flow sheet. Tolerated Procedure well.

## 2013-10-09 NOTE — Anesthesia Procedure Notes (Addendum)
Anesthesia Regional Block:  TAP block  Pre-Anesthetic Checklist: ,, timeout performed, Correct Patient, Correct Site, Correct Laterality, Correct Procedure, Correct Position, site marked, Risks and benefits discussed,  Surgical consent,  Pre-op evaluation,  At surgeon's request and post-op pain management  Laterality: Right  Prep: chloraprep       Needles:  Injection technique: Single-shot  Needle Type: Echogenic Needle     Needle Length: 9cm 9 cm Needle Gauge: 21 and 21 G    Additional Needles:  Procedures: ultrasound guided (picture in chart) TAP block Narrative:  Start time: 10/09/2013 12:18 PM End time: 10/09/2013 12:25 PM Injection made incrementally with aspirations every 5 mL.  Performed by: Personally  Anesthesiologist: Nada Libman, MD  Additional Notes: Ultrasound guidance used to: id relevant anatomy, confirm needle position, local anesthetic spread, avoidance of vascular puncture. Picture saved. No complications. Block performed personally by Jessy Oto. Albertina Parr, MD     Procedure Name: LMA Insertion Performed by: Terrance Mass Pre-anesthesia Checklist: Patient identified, Timeout performed, Emergency Drugs available, Suction available and Patient being monitored Patient Re-evaluated:Patient Re-evaluated prior to inductionOxygen Delivery Method: Circle system utilized Preoxygenation: Pre-oxygenation with 100% oxygen Intubation Type: IV induction Ventilation: Mask ventilation without difficulty LMA: LMA inserted LMA Size: 4.0 Tube type: Oral Number of attempts: 1 Placement Confirmation: breath sounds checked- equal and bilateral and positive ETCO2 Tube secured with: Tape Dental Injury: Teeth and Oropharynx as per pre-operative assessment

## 2013-10-10 ENCOUNTER — Encounter (HOSPITAL_BASED_OUTPATIENT_CLINIC_OR_DEPARTMENT_OTHER): Payer: Self-pay | Admitting: Surgery

## 2013-10-10 ENCOUNTER — Telehealth (INDEPENDENT_AMBULATORY_CARE_PROVIDER_SITE_OTHER): Payer: Self-pay | Admitting: *Deleted

## 2013-10-10 NOTE — Telephone Encounter (Signed)
Called pt to schedule p/o appt with Dr. Brantley Stage.  Asked pt how she was doing and she said she is doing fine, just more sore today, which she stated is normal.  She asked if she could add some Ibuprofen or Motrin with the pain medication combination, and I advised her she could add some Ibuprofen, but wait at least 2 hours after her Oxycodone.  Pt verbalized understanding.  Anderson Malta

## 2013-10-27 ENCOUNTER — Encounter (INDEPENDENT_AMBULATORY_CARE_PROVIDER_SITE_OTHER): Payer: Self-pay | Admitting: Surgery

## 2013-10-27 ENCOUNTER — Ambulatory Visit (INDEPENDENT_AMBULATORY_CARE_PROVIDER_SITE_OTHER): Payer: BC Managed Care – PPO | Admitting: Surgery

## 2013-10-27 VITALS — BP 114/82 | HR 72 | Resp 12 | Ht 64.0 in | Wt 155.6 lb

## 2013-10-27 DIAGNOSIS — Z9889 Other specified postprocedural states: Secondary | ICD-10-CM

## 2013-10-27 NOTE — Progress Notes (Signed)
Pt returns today after right inguinal  hernia repair.  Pain is well controlled.  Bowels are functioning.  Wound is clean.  On exam:  Incision is clean /dry/intact.  Area is soft without signs of hernia recurrence.  Impression:  Status repair of hernia RIH with mesh 3 weeks ago  Plan:  RTC PRN  Return to full activity 2 weeks.

## 2013-10-27 NOTE — Patient Instructions (Signed)
Resume full activity in 2 weeks.  Lift up to 25 lbs for 2 more weeks.  Use belt as needed.

## 2014-01-26 ENCOUNTER — Encounter (INDEPENDENT_AMBULATORY_CARE_PROVIDER_SITE_OTHER): Payer: Self-pay | Admitting: Surgery

## 2014-05-22 IMAGING — US US FETAL BPP W/O NONSTRESS
1 series · 12 of 28 positions shown · non-contrast
Comparison: none

OBSTETRICS REPORT
                      (Signed Final 08/16/2012 [DATE])

Service(s) Provided
 US UA CORD DOPPLER                                    76820.0
Indications
 Hypertension - Severe preeclampsia
 Uterine fibroids
Fetal Evaluation
 Num Of Fetuses:    1
 Fetal Heart Rate:  129                          bpm
 Cardiac Activity:  Observed
 Presentation:      Cephalic
 Placenta:          Anterior, above cervical os
 P. Cord            Previously Visualized
 Insertion:
 Comment:    BPP [DATE] in 30 minutes.
 Amniotic Fluid
 AFI FV:      Subjectively within normal limits
                                             Larg Pckt:    4.88  cm
Biophysical Evaluation
 Amniotic F.V:   Pocket => 2 cm two         F. Tone:        Observed
                 planes
 F. Movement:    Observed                   Score:          [DATE]
 F. Breathing:   Not Observed
Gestational Age
 LMP:           29w 1d        Date:  01/25/12                 EDD:   10/31/12
 Clinical EDD:  30w 1d                                        EDD:   10/24/12
 Best:          30w 1d     Det. By:  Clinical EDD             EDD:   10/24/12
Doppler - Fetal Vessels
 Umbilical Artery
 S/D:   3.79           90  %tile       RI:
 PI:    1.31                           PSV:       33.75   cm/s
 Absent DFV:    No     Reverse DFV:    No
Myomas
 Site                     L(cm)      W(cm)       D(cm)      Location
 Anterior Fundus
 Left                     3
 Blood Flow                  RI       PI       Comments
Impression
INDICATION: 32 yr old G1P0 at 49w0d with severe
 preeclampsia and previous finding of abnormal umbilical
 artery Doppler studies for BPP and Doppler studies.

[Series 1: us fetal bpp w/o nonstress · 0.23mm/px · 28 acquisitions, 12 frames shown]
[im 2/28]
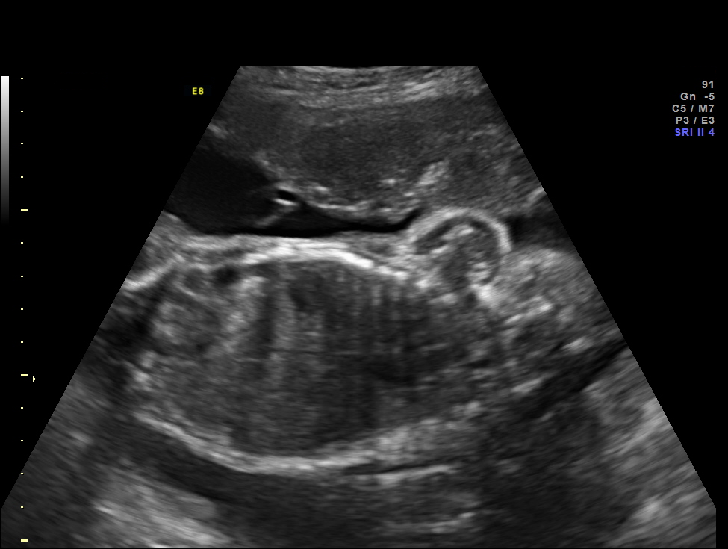
[im 4/28]
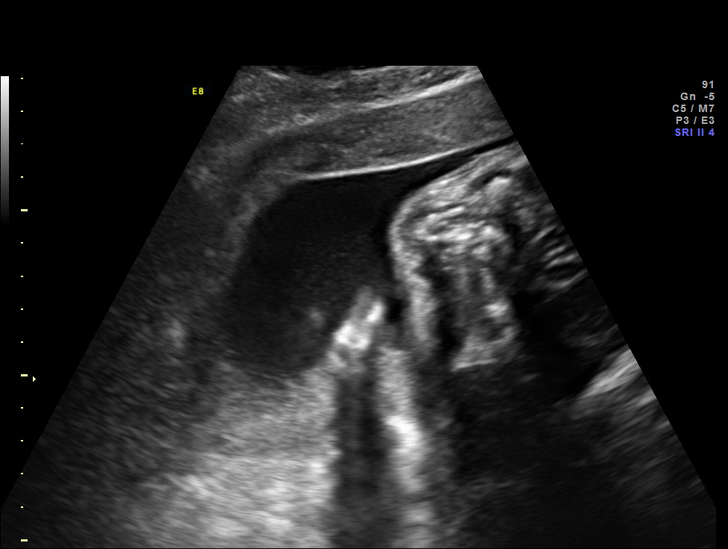
[im 6/28]
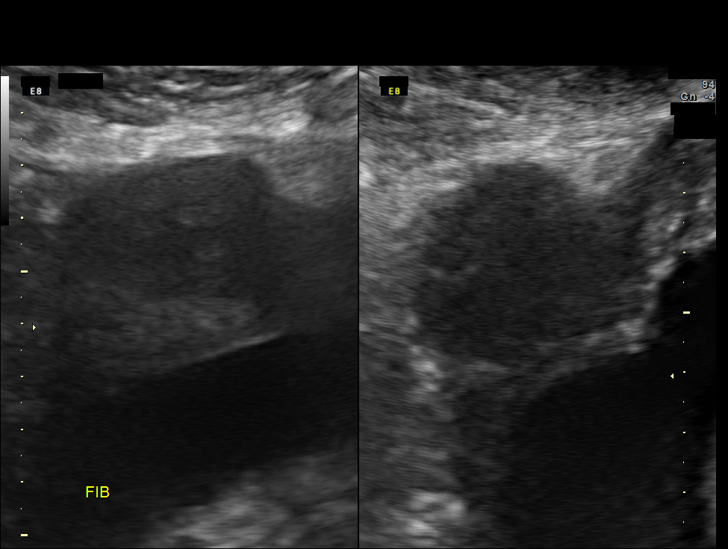
[im 9/28]
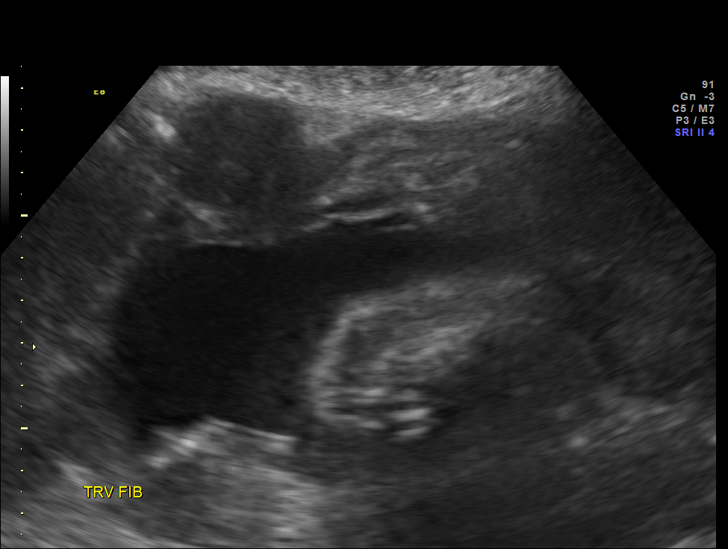
[im 11/28]
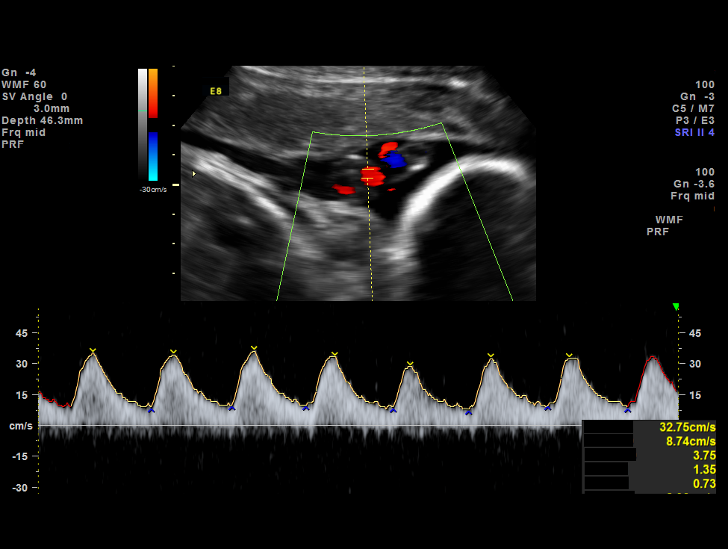
[im 13/28]
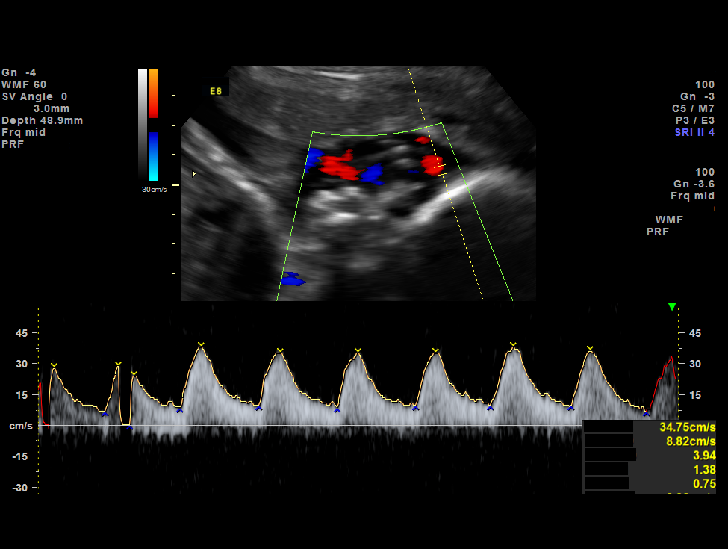
[im 16/28]
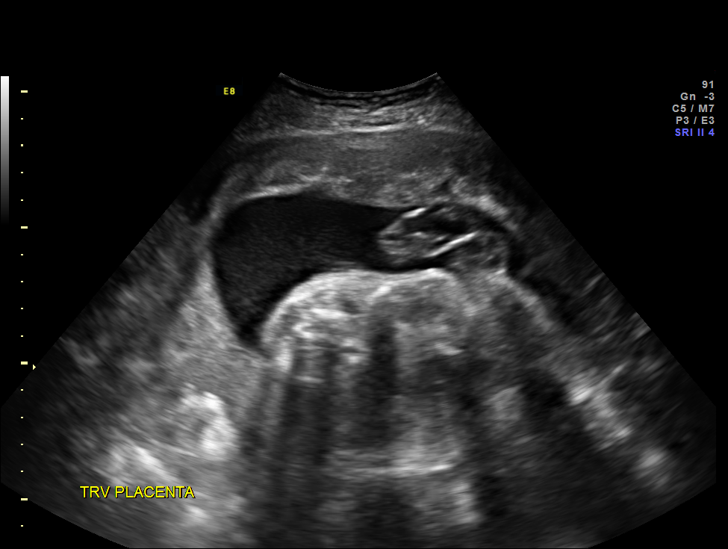
[im 18/28]
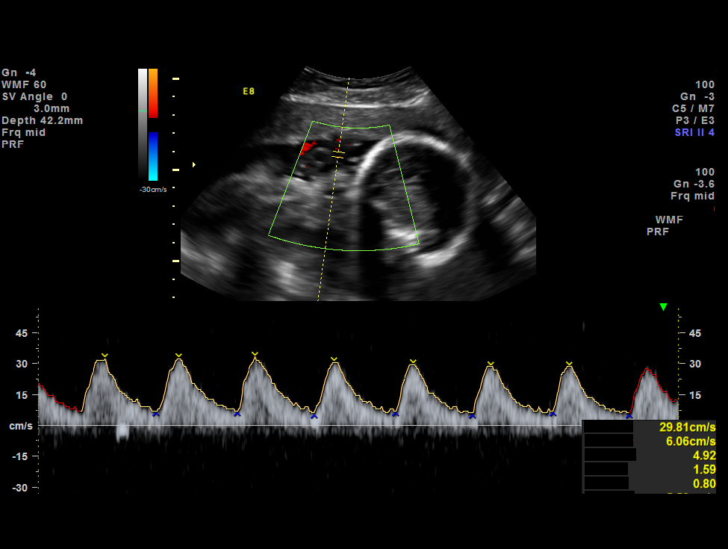
[im 20/28]
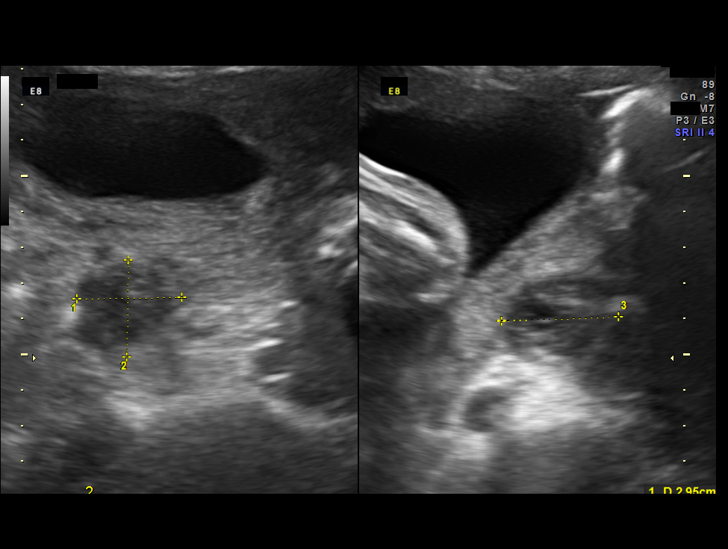
[im 23/28]
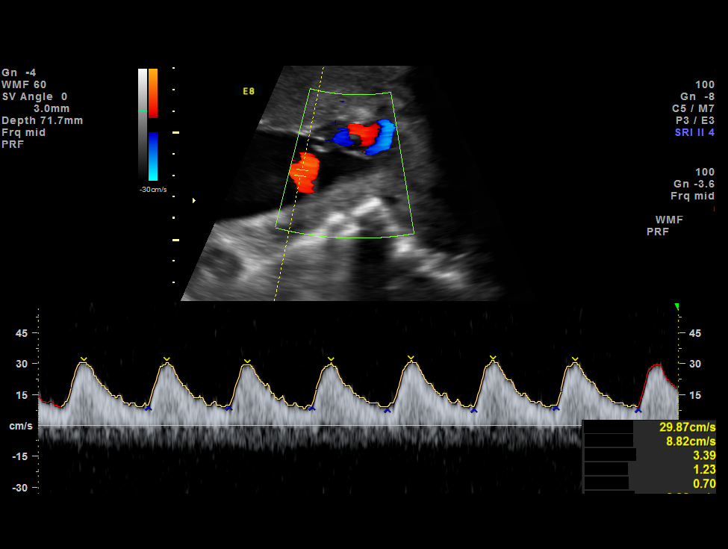
[im 25/28]
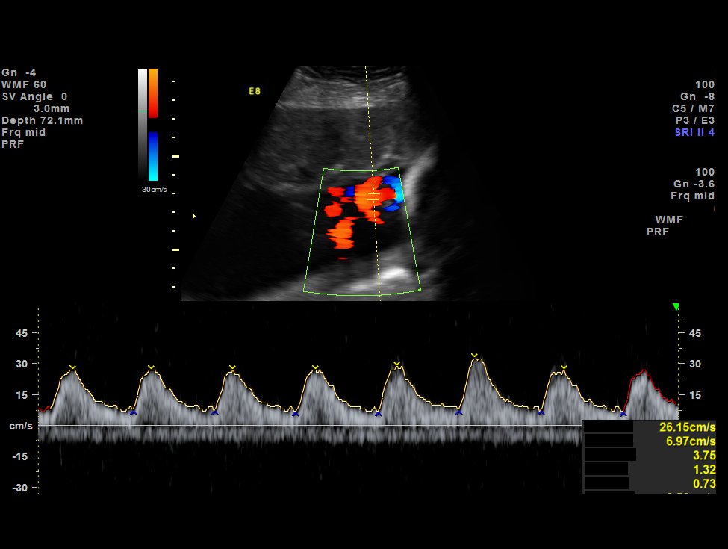
[im 27/28]
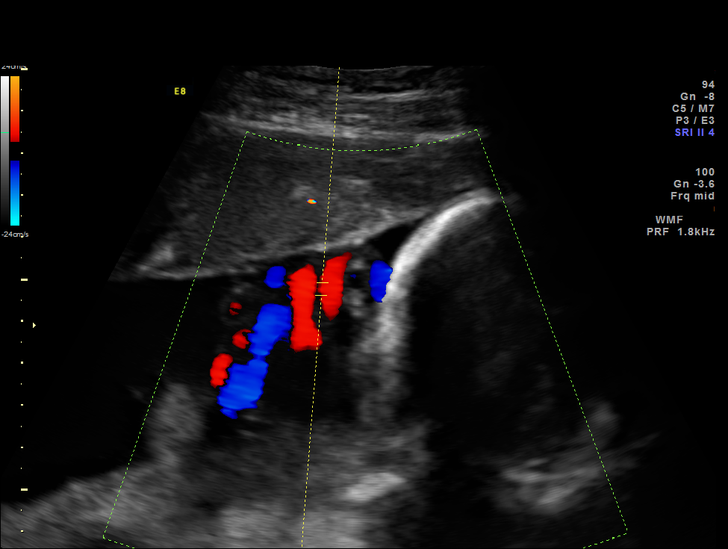

[12 of 28 positions shown; findings below may reference images not displayed]

FINDINGS: 1. Single intrauterine pregnancy.
 2. Anterior placenta without evidence of previa.
 3. Normal amniotic fluid volume.
 4. Normal umbilical artery Doppler studies.
 5. Biophysical profile is [DATE] (-2 for breathing).
 6. Uterine fibroids are again seen the largest is 4cm.
Recommendations

 1. Severe preeclampsia:
 - previously counseled
 - Hard/Isern Haszard
 - given labs are normal and patient is currently asymptomatic
 feel expectant management is indicated at this time
 - would recommend discontinue magnesium sulfate at this
 time
 - recommend continue inpatient management until delivery
 - recommend delivery at 34 weeks or sooner for poorly
 controlled blood pressures, neurologic symptoms, oliguria,
 evidence of HELLP syndrome, reversal of flow on Doppler
 studies, or nonreassuring fetal testing
 - if delivery indicated recommend magnesium sulfate for
 seizure prophylaxis through induction, delivery, and 24 hours
 postpartum (also to be used for fetal neuroprotection prior to
 32 weeks gestation)
 - recommend at least twice weekly lab studies
 - recommend at least daily assessment of fetal well being
 - patient currently on labetalol; recommend titrate to keep
 blood pressures <150/100
 - recommend fetal growth at least every 4 weeks (unsure
 when last growth was performed)
 2. BPP [DATE]:
 - recommend d/c magnesium sulfate
 - if fetus has reactive tracing no follow up indicated today
 - if not reactive recommend repeat BPP in 6 hours
 3. Umbilical artery Doppler studies normal today
 - given previous finding of intermittent absent end diastolic
 flow recommend Doppler studies 3x/week
 Discussed with Dr. Fransisco

 questions or concerns.

## 2014-05-24 IMAGING — US US FETAL BPP W/O NONSTRESS
1 series · 5 of 5 positions shown · non-contrast
Comparison: none

CLINICAL DATA: Severe preeclampsia at 30 weeks 3 days.

 BIOPHYSICAL PROFILE
 Number of Fetuses: 1
Heart Rate: 134 bpm
Presentation:  Transverse head left
Amniotic Fluid (Subjective): Normal
Vertical pocket:  8.4cm
BPP:
Movement:  2   Time:  13 minutes
Breathing: 2
Tone: 2
Amniotic Fluid: 2
 Total Score: 8 of 8

[Series 1: us fetal bpp w/o nonstress · non-contrast · 5 acquisitions, 5 frames shown]
[im 1/5]
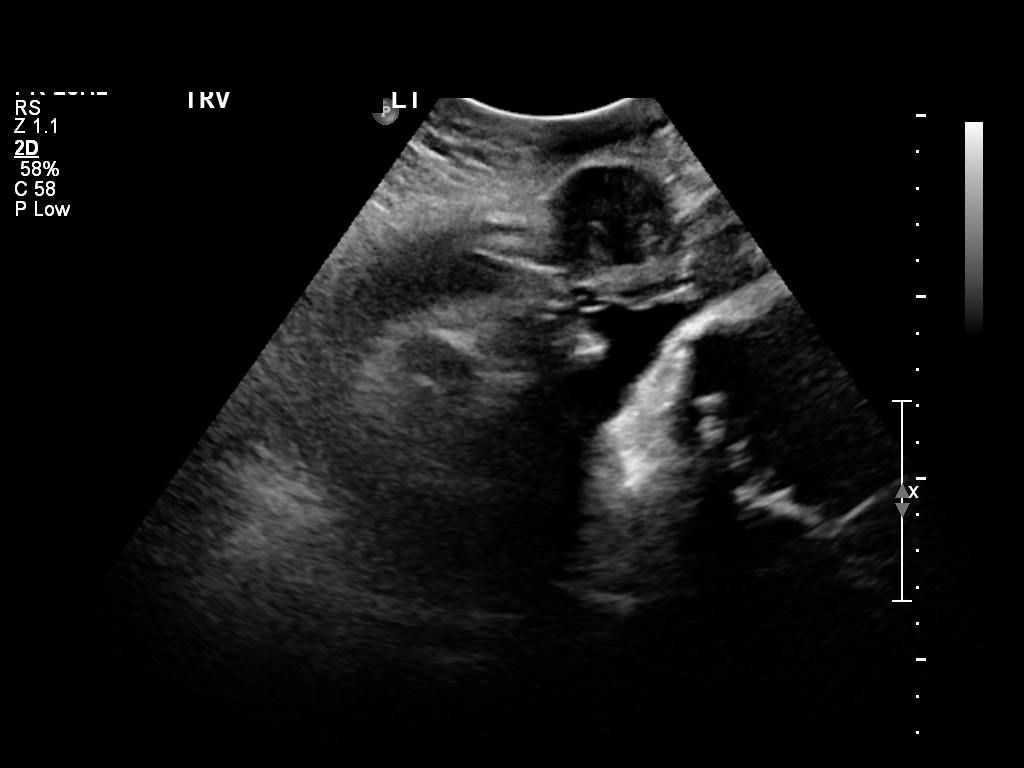
[im 2/5]
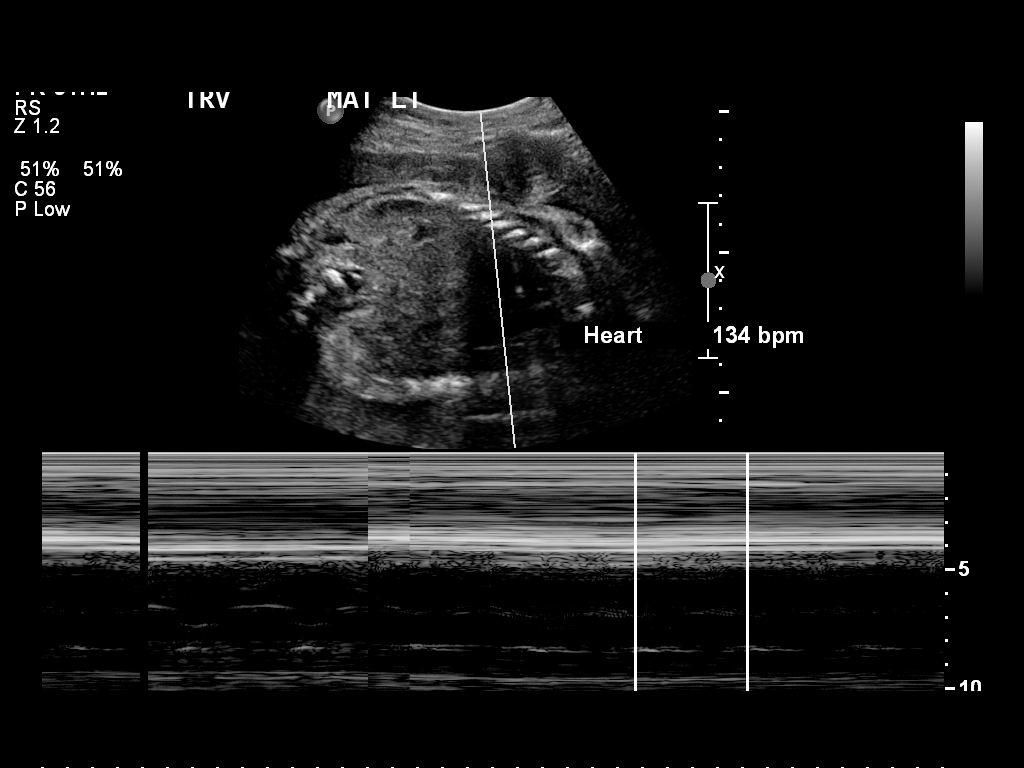
[im 3/5]
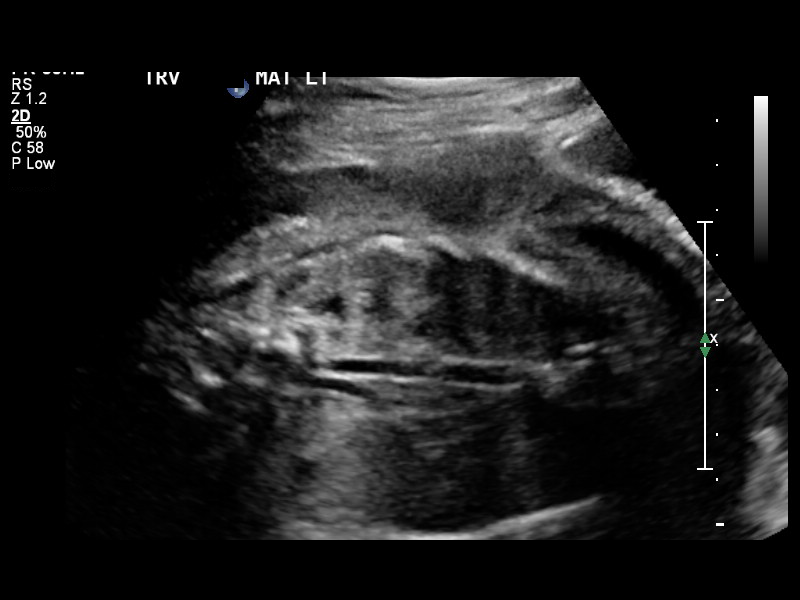
[im 4/5]
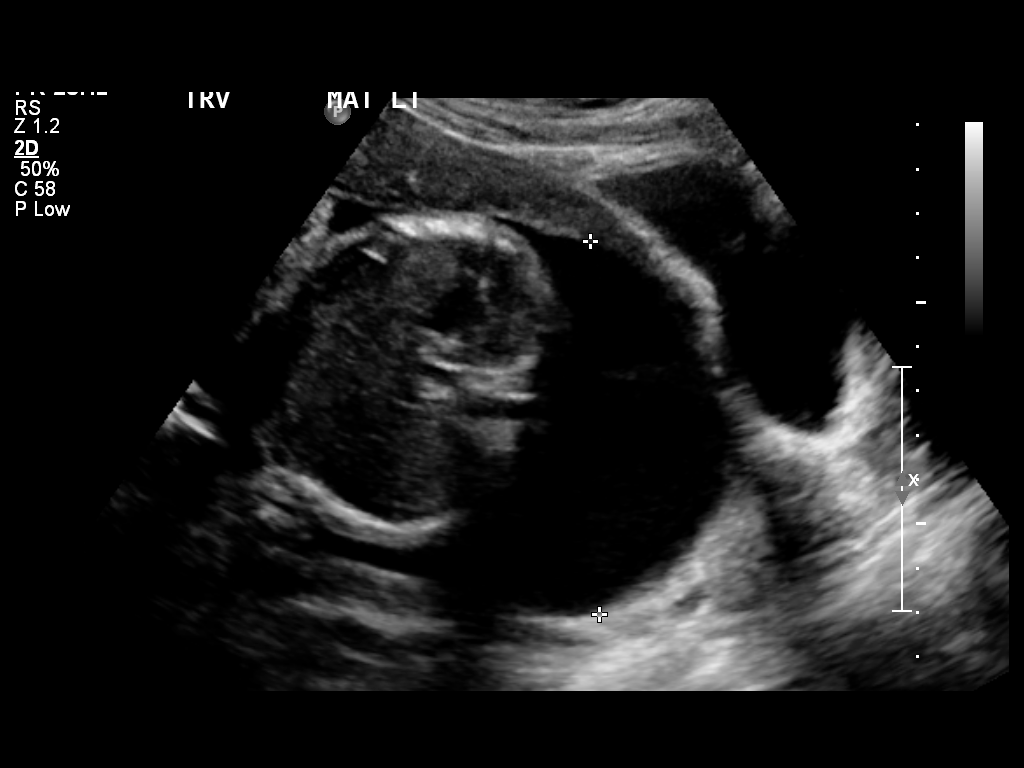
[im 5/5]
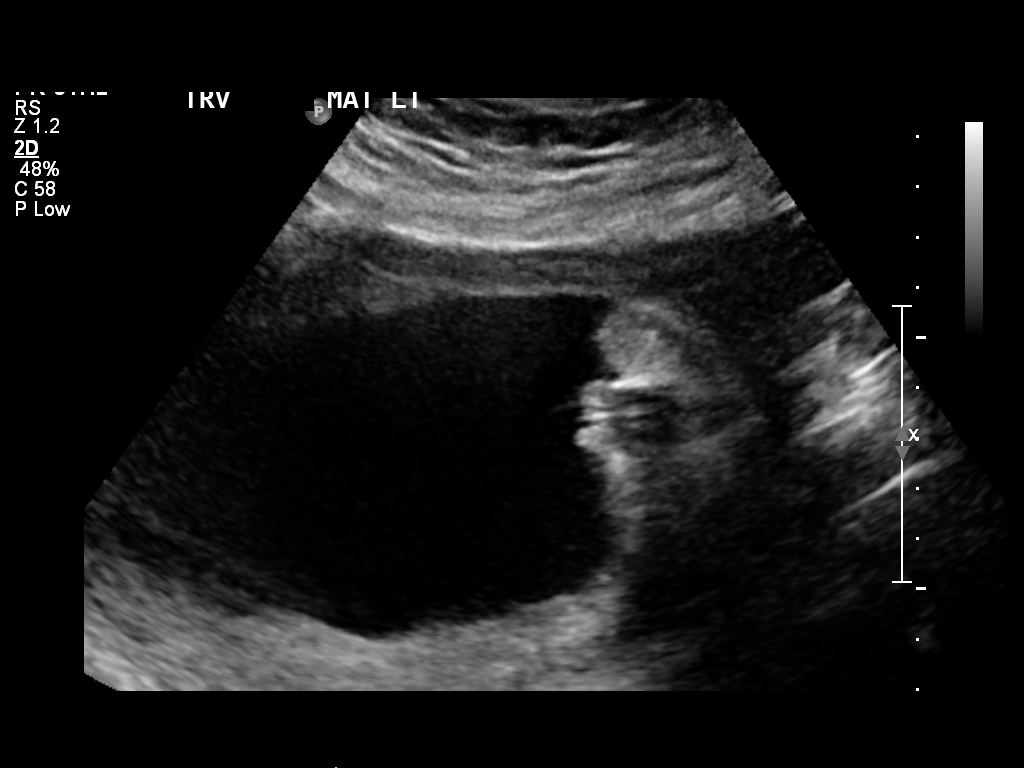

[5 of 5 positions shown; findings below may reference images not displayed]

IMPRESSION: Biophysical profile [DATE].

## 2014-05-26 IMAGING — US US FETAL BPP W/O NONSTRESS
1 series · 16 of 28 positions shown · non-contrast
Comparison: none

[Series 1: us fetal bpp w/o nonstress · 0.23mm/px · 16 of 60 slices shown]
[im 1/60]
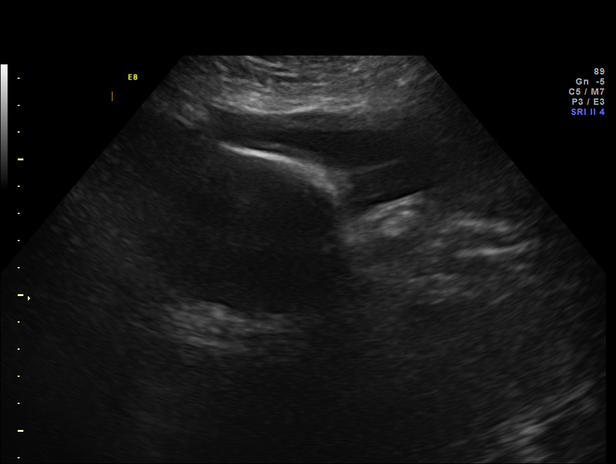
[im 5/60]
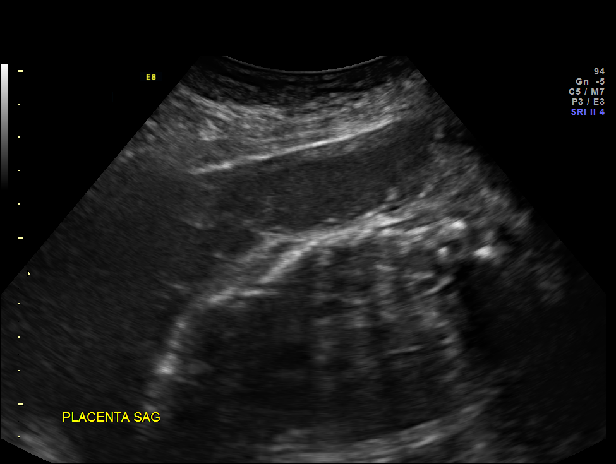
[im 9/60]
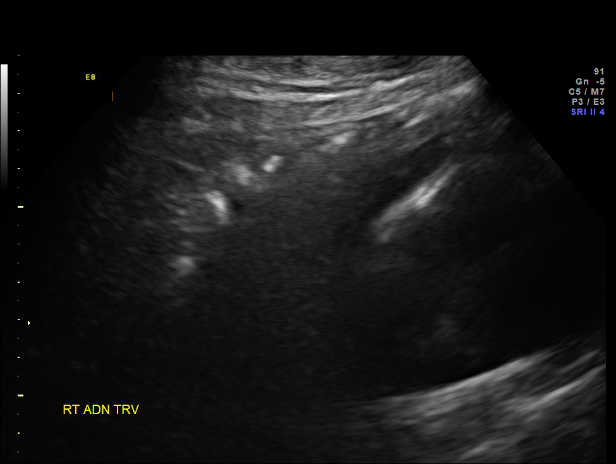
[im 14/60]
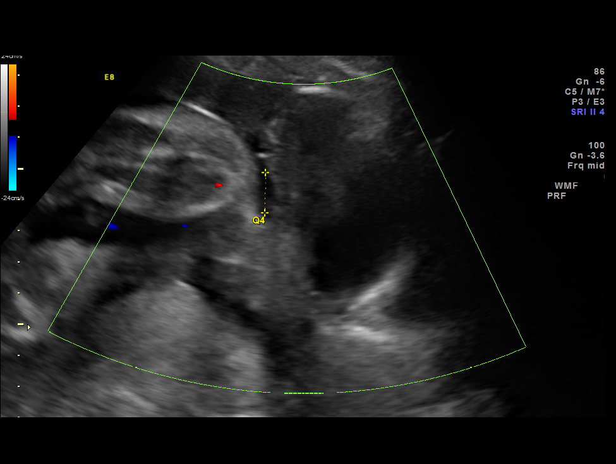
[im 16/60]
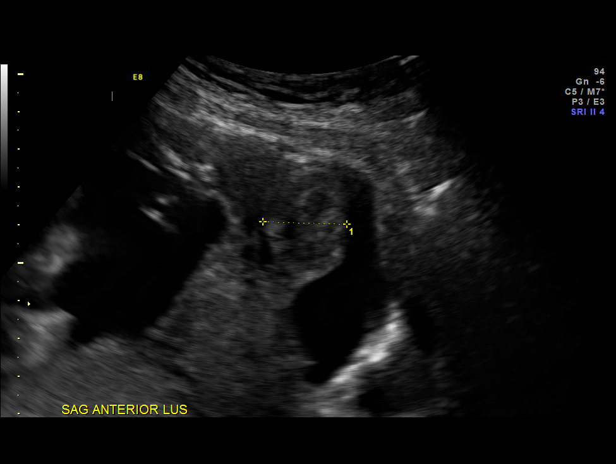
[im 20/60]
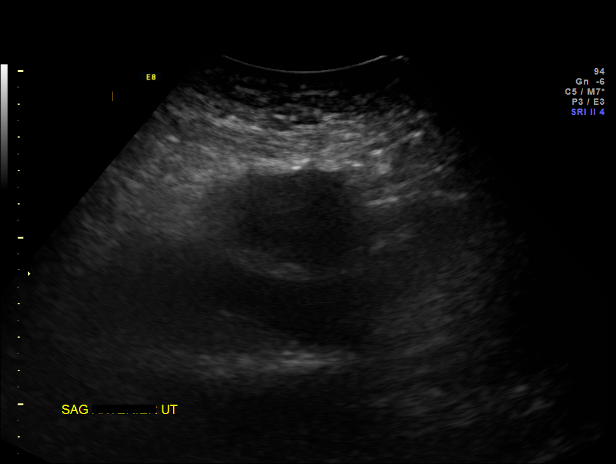
[im 25/60]
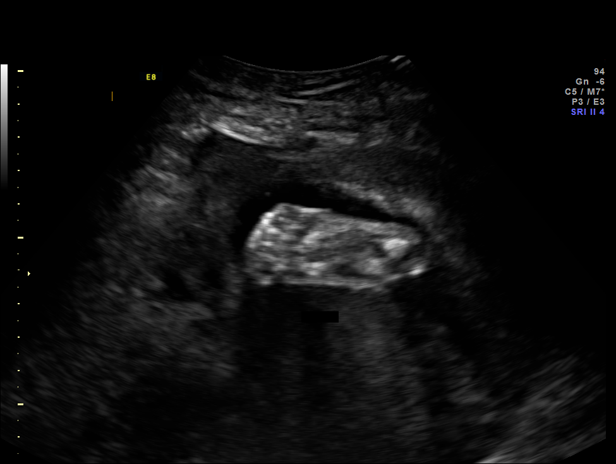
[im 29/60]
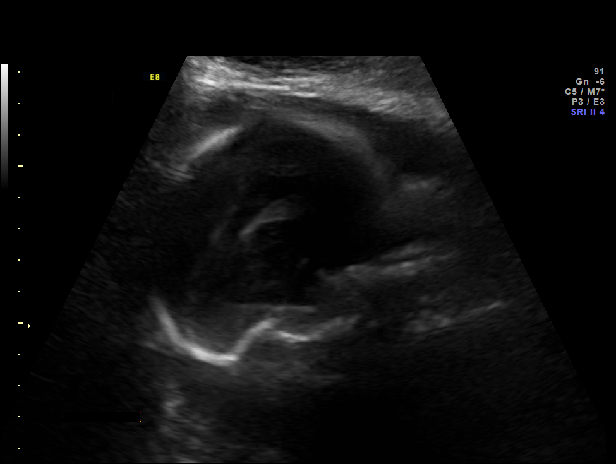
[im 31/60]
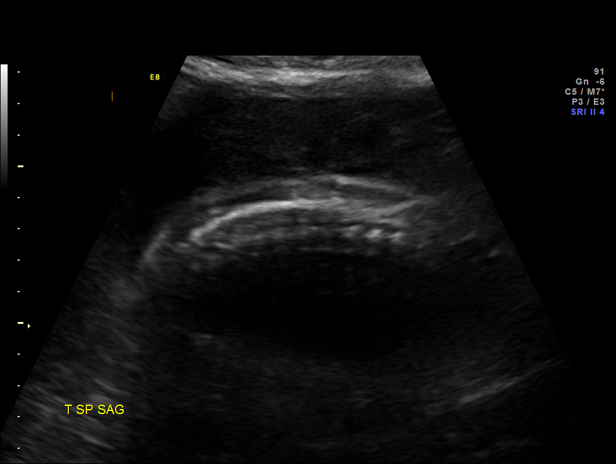
[im 35/60]
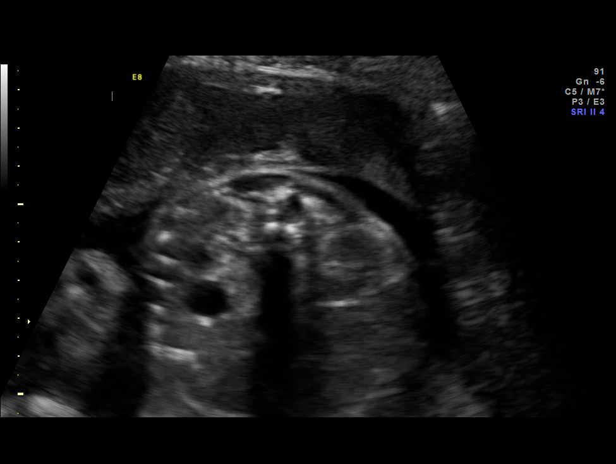
[im 40/60]
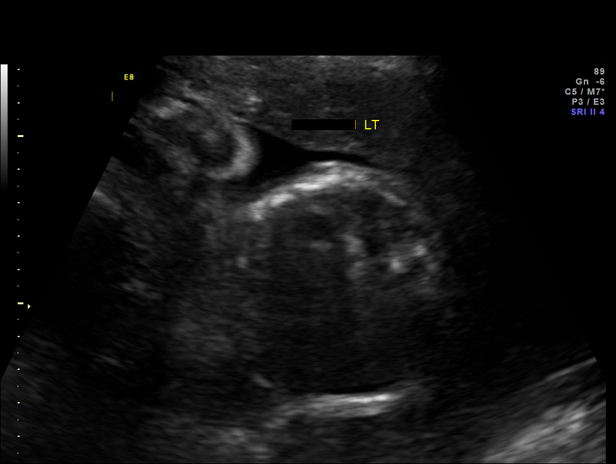
[im 44/60]
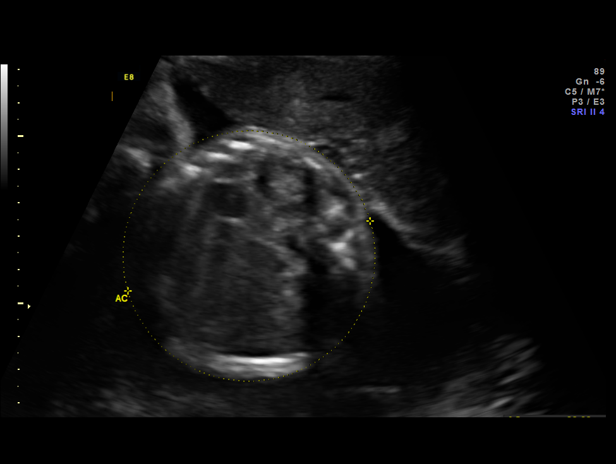
[im 46/60]
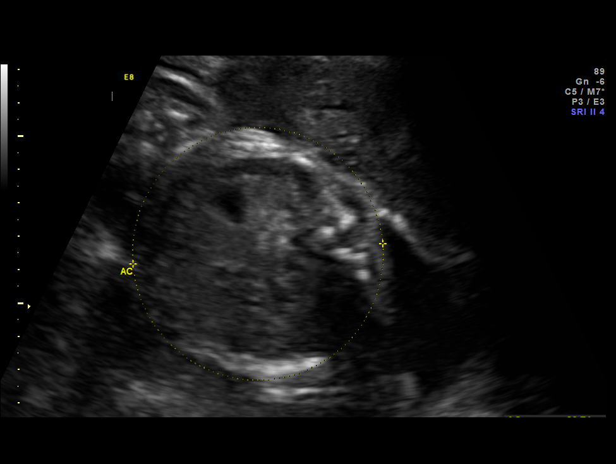
[im 51/60]
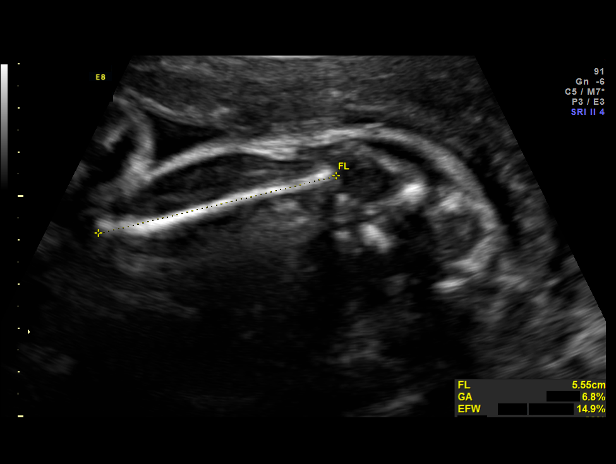
[im 55/60]
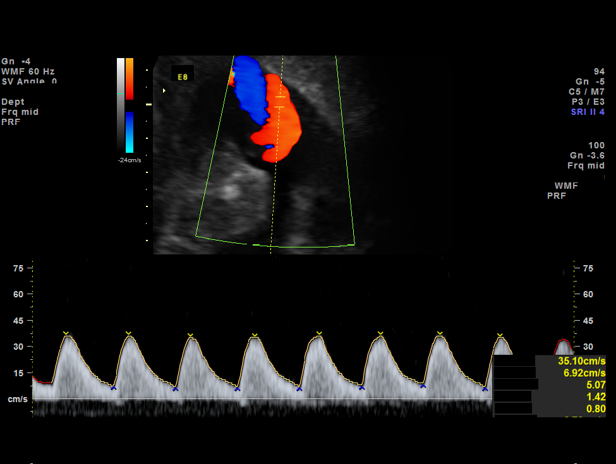
[im 60/60]
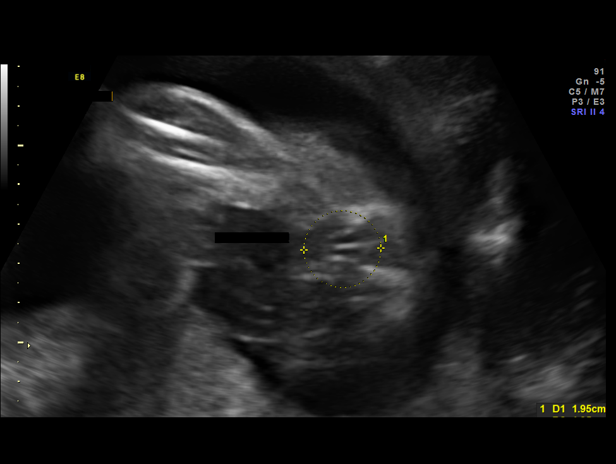

[16 of 28 positions shown; findings below may reference images not displayed]

OBSTETRICS REPORT
                      (Signed Final 08/20/2012 [DATE])

Service(s) Provided

 US OB COMP + 14 WK                                    76805.1
 US UA CORD DOPPLER                                    76820.0
Indications

 Hypertension - Severe preeclampsia
 Uterine fibroids
 Herpes simplex virus (JIM)
Fetal Evaluation

 Num Of Fetuses:    1
 Fetal Heart Rate:  134                          bpm
 Cardiac Activity:  Observed
 Presentation:      Breech
 Placenta:          Anterior, above cervical os
 P. Cord            Not well visualized
 Insertion:

 Amniotic Fluid
 AFI FV:      Subjectively within normal limits
 AFI Sum:     14.35   cm       49  %Tile     Larg Pckt:    6.26  cm
 RUQ:   1.83    cm   RLQ:    1.29   cm    LUQ:   4.97    cm   LLQ:    6.26   cm
Biophysical Evaluation

 Amniotic F.V:   Pocket => 2 cm two         F. Tone:        Observed
                 planes
 F. Movement:    Observed                   Score:          [DATE]
 F. Breathing:   Observed
Biometry

 BPD:     68.4  mm     G. Age:  27w 4d                CI:         75.4   70 - 86
 OFD:     90.7  mm                                    FL/HC:      21.0   19.3 -

 HC:     258.9  mm     G. Age:  28w 1d      < 3  %    HC/AC:      1.09   0.96 -

 AC:     236.7  mm     G. Age:  28w 0d      < 3  %    FL/BPD:     79.4   71 - 87
 FL:      54.3  mm     G. Age:  28w 5d      < 3  %    FL/AC:      22.9   20 - 24
 HUM:     48.8  mm     G. Age:  28w 5d        9  %

 Est. FW:    3317  gm    2 lb 10 oz      14  %
Gestational Age

 LMP:           29w 5d        Date:  01/25/12                 EDD:   10/31/12
 U/S Today:     28w 1d                                        EDD:   11/11/12
 Best:          30w 5d     Det. By:  Early Ultrasound         EDD:   10/24/12
                                     (03/14/12)
Anatomy

 Cranium:          Appears normal         Aortic Arch:      Not well visualized
 Fetal Cavum:      Not well visualized    Ductal Arch:      Not well visualized
 Ventricles:       Not well visualized    Diaphragm:        Appears normal
 Choroid Plexus:   Appears normal         Stomach:          Appears normal
 Cerebellum:       Not well visualized    Abdomen:          Appears normal
 Posterior Fossa:  Not well visualized    Abdominal Wall:   Appears nml (cord
                                                            insert, abd wall)
 Nuchal Fold:      Not applicable (>20    Cord Vessels:     Appears normal (3
                   wks GA)                                  vessel cord)
 Face:             Not well visualized    Kidneys:          Appear normal
 Lips:             Not well visualized    Bladder:          Appears normal
 Heart:            Not well visualized    Spine:            Appears normal
 RVOT:             Not well visualized    Lower             Visualized
                                          Extremities:
 LVOT:             Not well visualized    Upper             Visualized
                                          Extremities:

 Other:  Fetus appears to be a female. Technically difficult due to advanced
         GA and fetal position.
Doppler - Fetal Vessels

 Umbilical Artery
 S/D:   4.46       > 97.5  %tile       RI:
 PI:    1.39                           PSV:       39.82   cm/s
 Umbilical Artery
 Absent DFV:    No     Reverse DFV:    No

Cervix Uterus Adnexa

 Cervical Length:    4.1      cm

 Cervix:       Normal appearance by transabdominal scan.

 Adnexa:     No abnormality visualized.
Myomas

 Site                     L(cm)      W(cm)       D(cm)      Location
 Anterior LUS             2.2        3
 Anterior

 Blood Flow                  RI       PI       Comments

Comments

 Ms. Schlosser returns today for BPP and UA Dopplers due to
 severe preeclampsia.  She is currently on 400 mg Labetolol q
 6 hrs and continues to have severe range blood pressures on
 the antepartum floor.  Since admission, she has experienced
 31 lbs weight gain.

 Ultrasound today reveals an estimated fetal weight at the
 14th %tile with asymmetric growth (AC < 3rd %tile).  The
 umbilical artery Doppler studies are elevated but without
 evidence of absent or reversed diastolic flow.  The fetus is
 active with a BPP of [DATE].
Impression

 Single IUP at 30 [DATE] weeks
 Severe preeclampsia
 Estimated fetal weight at the 14th %tile with asymmetric
 growth (AC < 3rd %tile)
 Elevated UA Doppler studies without evidence of absent or
 reveresed flow
 Breech presentation
 BPP [DATE]
 Normal amniotic fluid volume

 Anterior lower uterine segment myoma (see above)
Recommendations

 Recommend delivery due to severe range blood pressures
 despite antihypertensive medications.
 Magnesium sulfate prophylaxis
 Cesarean section due to breech presentation

 questions or concerns.

## 2015-10-01 ENCOUNTER — Other Ambulatory Visit: Payer: Self-pay | Admitting: Obstetrics and Gynecology

## 2015-10-01 DIAGNOSIS — Z1231 Encounter for screening mammogram for malignant neoplasm of breast: Secondary | ICD-10-CM

## 2015-10-06 ENCOUNTER — Ambulatory Visit: Payer: Medicaid Other

## 2015-10-06 ENCOUNTER — Ambulatory Visit
Admission: RE | Admit: 2015-10-06 | Discharge: 2015-10-06 | Disposition: A | Discharge status: Home / Self Care | Payer: BLUE CROSS/BLUE SHIELD | Source: Ambulatory Visit | Attending: Obstetrics and Gynecology | Admitting: Obstetrics and Gynecology

## 2015-10-06 DIAGNOSIS — Z1231 Encounter for screening mammogram for malignant neoplasm of breast: Secondary | ICD-10-CM

## 2015-10-11 ENCOUNTER — Other Ambulatory Visit: Payer: Self-pay | Admitting: Obstetrics and Gynecology

## 2015-10-11 DIAGNOSIS — R928 Other abnormal and inconclusive findings on diagnostic imaging of breast: Secondary | ICD-10-CM

## 2015-10-14 ENCOUNTER — Other Ambulatory Visit: Payer: Self-pay | Admitting: Family Medicine

## 2015-10-22 ENCOUNTER — Ambulatory Visit
Admission: RE | Admit: 2015-10-22 | Discharge: 2015-10-22 | Disposition: A | Discharge status: Home / Self Care | Payer: BLUE CROSS/BLUE SHIELD | Source: Ambulatory Visit | Attending: Obstetrics and Gynecology | Admitting: Obstetrics and Gynecology

## 2015-10-22 DIAGNOSIS — R928 Other abnormal and inconclusive findings on diagnostic imaging of breast: Secondary | ICD-10-CM

## 2015-12-25 ENCOUNTER — Other Ambulatory Visit: Payer: Self-pay | Admitting: Obstetrics and Gynecology

## 2015-12-25 DIAGNOSIS — Z803 Family history of malignant neoplasm of breast: Secondary | ICD-10-CM

## 2016-12-22 ENCOUNTER — Other Ambulatory Visit: Payer: Self-pay | Admitting: Family Medicine

## 2016-12-22 DIAGNOSIS — Z1231 Encounter for screening mammogram for malignant neoplasm of breast: Secondary | ICD-10-CM

## 2017-01-03 ENCOUNTER — Ambulatory Visit
Admission: RE | Admit: 2017-01-03 | Discharge: 2017-01-03 | Disposition: A | Discharge status: Home / Self Care | Payer: 59 | Source: Ambulatory Visit | Attending: Family Medicine | Admitting: Family Medicine

## 2017-01-03 DIAGNOSIS — Z1231 Encounter for screening mammogram for malignant neoplasm of breast: Secondary | ICD-10-CM

## 2019-02-03 ENCOUNTER — Other Ambulatory Visit: Payer: Self-pay | Admitting: Family Medicine

## 2019-02-03 DIAGNOSIS — Z1231 Encounter for screening mammogram for malignant neoplasm of breast: Secondary | ICD-10-CM

## 2019-02-05 ENCOUNTER — Other Ambulatory Visit: Payer: Self-pay

## 2019-02-05 ENCOUNTER — Ambulatory Visit
Admission: RE | Admit: 2019-02-05 | Discharge: 2019-02-05 | Disposition: A | Discharge status: Home / Self Care | Payer: BC Managed Care – PPO | Source: Ambulatory Visit | Attending: Family Medicine | Admitting: Family Medicine

## 2019-02-05 DIAGNOSIS — Z1231 Encounter for screening mammogram for malignant neoplasm of breast: Secondary | ICD-10-CM

## 2021-04-06 DIAGNOSIS — R0981 Nasal congestion: Secondary | ICD-10-CM | POA: Diagnosis not present

## 2021-04-06 DIAGNOSIS — J0100 Acute maxillary sinusitis, unspecified: Secondary | ICD-10-CM | POA: Diagnosis not present

## 2021-05-31 ENCOUNTER — Other Ambulatory Visit: Payer: Self-pay

## 2021-05-31 ENCOUNTER — Encounter: Payer: Self-pay | Admitting: Allergy

## 2021-05-31 ENCOUNTER — Ambulatory Visit: Payer: 59 | Admitting: Allergy

## 2021-05-31 VITALS — BP 100/66 | HR 65 | Temp 98.3°F | Resp 16 | Ht 64.0 in | Wt 159.0 lb

## 2021-05-31 DIAGNOSIS — J4540 Moderate persistent asthma, uncomplicated: Secondary | ICD-10-CM | POA: Diagnosis not present

## 2021-05-31 DIAGNOSIS — H1013 Acute atopic conjunctivitis, bilateral: Secondary | ICD-10-CM | POA: Diagnosis not present

## 2021-05-31 DIAGNOSIS — J3089 Other allergic rhinitis: Secondary | ICD-10-CM | POA: Insufficient documentation

## 2021-05-31 MED ORDER — RYALTRIS 665-25 MCG/ACT NA SUSP: 1.0000 | Freq: Two times a day (BID) | NASAL | 5 refills | Status: DC

## 2021-05-31 MED ORDER — PROAIR DIGIHALER 108 (90 BASE) MCG/ACT IN AEPB: 2.0000 | INHALATION_SPRAY | RESPIRATORY_TRACT | 1 refills | Status: DC | PRN

## 2021-05-31 MED ORDER — AIRDUO DIGIHALER 232-14 MCG/ACT IN AEPB: 1.0000 | INHALATION_SPRAY | Freq: Two times a day (BID) | RESPIRATORY_TRACT | 3 refills | Status: DC
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
## 2021-05-31 NOTE — Patient Instructions (Signed)
Today's skin testing showed: ?Positive to mold, cat, dog, horse, dust mites.  ?Negative to [place] pig and common foods.  ? ?Results given. ? ?Asthma ?Daily controller medication(s): START Airduo 232 mcg 1 puff twice a day with spacer and rinse mouth afterwards. Sample given.  ?This replaces Symbicort. ?Let me know if not covered or too expensive.  ?May use albuterol rescue inhaler 2 puffs every 4 to 6 hours as needed for shortness of breath, chest tightness, coughing, and wheezing.  Monitor frequency of use.  ?Asthma control goals:  ?Full participation in all desired activities (may need albuterol before activity) ?Albuterol use two times or less a week on average (not counting use with activity) ?Cough interfering with sleep two times or less a month ?Oral steroids no more than once a year ?No hospitalizations  ? ?Environmental allergies ?Start environmental control measures as below. ?Use over the counter antihistamines such as Zyrtec (cetirizine), Claritin (loratadine), Allegra (fexofenadine), or Xyzal (levocetirizine) daily as needed. May take twice a day during allergy flares. May switch antihistamines every few months. ?Start Ryaltris (olopatadine + mometasone nasal spray combination) 1-2 sprays per nostril twice a day. Sample given. ?This replaces Flonase. ?Let me know if this is too expensive. ?Nasal saline spray (i.e., Simply Saline) or nasal saline lavage (i.e., NeilMed) is recommended as needed and prior to medicated nasal sprays. ?Consider allergy injections for long term control if above medications do not help the symptoms - handout given.  ? ?Follow up in 2 months or sooner if needed.   ? ?Pet Allergen Avoidance: ?Contrary to popular opinion, there are no ?hypoallergenic? breeds of dogs or cats. That is because people are not allergic to an animal?s hair, but to an allergen found in the animal's saliva, dander (dead skin flakes) or urine. Pet allergy symptoms typically occur within minutes. For some  people, symptoms can build up and become most severe 8 to 12 hours after contact with the animal. People with severe allergies can experience reactions in public places if dander has been transported on the pet owners? clothing. ?Keeping an animal outdoors is only a partial solution, since homes with pets in the yard still have higher concentrations of animal allergens. ?Before getting a pet, ask your allergist to determine if you are allergic to animals. If your pet is already considered part of your family, try to minimize contact and keep the pet out of the bedroom and other rooms where you spend a great deal of time. ?As with dust mites, vacuum carpets often or replace carpet with a hardwood floor, tile or linoleum. ?High-efficiency particulate air (HEPA) cleaners can reduce allergen levels over time. ?While dander and saliva are the source of cat and dog allergens, urine is the source of allergens from rabbits, hamsters, mice and [place] pigs; so ask a non-allergic family member to clean the animal?s cage. ?If you have a pet allergy, talk to your allergist about the potential for allergy immunotherapy (allergy shots). This strategy can often provide long-term relief. ?Control of House Dust Mite Allergen ?Dust mite allergens are a common trigger of allergy and asthma symptoms. While they can be found throughout the house, these microscopic creatures thrive in warm, humid environments such as bedding, upholstered furniture and carpeting. ?Because so much time is spent in the bedroom, it is essential to reduce mite levels there.  ?Encase pillows, mattresses, and box springs in special allergen-proof fabric covers or airtight, zippered plastic covers.  ?Bedding should be washed weekly in hot water (130? F)  and dried in a hot dryer. Allergen-proof covers are available for comforters and pillows that can?t be regularly washed.  ?Wash the allergy-proof covers every few months. Minimize clutter in the bedroom. Keep pets  out of the bedroom.  ?Keep humidity less than 50% by using a dehumidifier or air conditioning. You can buy a humidity measuring device called a hygrometer to monitor this.  ?If possible, replace carpets with hardwood, linoleum, or washable area rugs. If that's not possible, vacuum frequently with a vacuum that has a HEPA filter. ?Remove all upholstered furniture and non-washable window drapes from the bedroom. ?Remove all non-washable stuffed toys from the bedroom.  Wash stuffed toys weekly. ?Mold Control ?Mold and fungi can grow on a variety of surfaces provided certain temperature and moisture conditions exist.  ?Outdoor molds grow on plants, decaying vegetation and soil. The major outdoor mold, Alternaria and Cladosporium, are found in very high numbers during hot and dry conditions. Generally, a late summer - fall peak is seen for common outdoor fungal spores. Rain will temporarily lower outdoor mold spore count, but counts rise rapidly when the rainy period ends. ?The most important indoor molds are Aspergillus and Penicillium. Dark, humid and poorly ventilated basements are ideal sites for mold growth. The next most common sites of mold growth are the bathroom and the kitchen. ?Outdoor (Seasonal) Mold Control ?Use air conditioning and keep windows closed. ?Avoid exposure to decaying vegetation. ?Avoid leaf raking. ?Avoid grain handling. ?Consider wearing a face mask if working in moldy areas.  ?Indoor (Perennial) Mold Control  ?Maintain humidity below 50%. ?Get rid of mold growth on hard surfaces with water, detergent and, if necessary, 5% bleach (do not mix with other cleaners). Then dry the area completely. If mold covers an area more than 10 square feet, consider hiring an indoor environmental professional. ?For clothing, washing with soap and water is best. If moldy items cannot be cleaned and dried, throw them away. ?Remove sources e.g. contaminated carpets. ?Repair and seal leaking roofs or pipes. Using  dehumidifiers in damp basements may be helpful, but empty the water and clean units regularly to prevent mildew from forming. All rooms, especially basements, bathrooms and kitchens, require ventilation and cleaning to deter mold and mildew growth. Avoid carpeting on concrete or damp floors, and storing items in damp areas. ? ? ? ?

## 2021-05-31 NOTE — Assessment & Plan Note (Signed)
Perennial rhinoconjunctivitis symptoms for many years but worse since December 2022.  Last skin testing in her teens and was on AIT in her 44s for only 1 year.  Symptoms worst in the mornings. 2 [place] pigs, 2 cats, 1 dog at home.  ?? Today's skin testing showed: Positive to mold, cat, dog, horse, dust mites.  Negative to [place] pig.  ?? Start environmental control measures as below. ?? Use over the counter antihistamines such as Zyrtec (cetirizine), Claritin (loratadine), Allegra (fexofenadine), or Xyzal (levocetirizine) daily as needed. May take twice a day during allergy flares. May switch antihistamines every few months. ?? Start Ryaltris (olopatadine + mometasone nasal spray combination) 1-2 sprays per nostril twice a day. Sample given. ?o This replaces Flonase. ?o Let me know if this is too expensive. ?? Nasal saline spray (i.e., Simply Saline) or nasal saline lavage (i.e., NeilMed) is recommended as needed and prior to medicated nasal sprays. ?? Consider allergy injections for long term control if above medications do not help the symptoms - handout given.  ?

## 2021-05-31 NOTE — Assessment & Plan Note (Addendum)
Asthma since birth but noticed worsening symptoms since December 2022.  Currently using daughter's Symbicort 80 mcg 2 puffs twice a day and albuterol daily with some benefit.  1 course of oral prednisone recently.  2 [place] pigs, 2 cats and 1 dog at home. ?? Today's skin testing showed: Positive to mold, cat, dog, horse, dust mites.  Negative to [place] pig and common foods.  ?? Today's spirometry was normal with 10% improvement in FEV1 postbronchodilator treatment.  Clinically feeling improved. ?? Most likely worsened with the additional pets at home the past year.  ?? Daily controller medication(s): START Airduo 232 mcg 1 puff twice a day with spacer and rinse mouth afterwards. Sample given.  ?o This replaces Symbicort. ?o Let me know if not covered or too expensive.  ?? May use albuterol rescue inhaler 2 puffs every 4 to 6 hours as needed for shortness of breath, chest tightness, coughing, and wheezing.  Monitor frequency of use.  ?? Get spirometry at next visit. ?

## 2021-08-22 NOTE — Progress Notes (Unsigned)
Follow Up Note  RE: Laurie Randolph MRN: [id number] DOB: 1981-02-02 Date of Office Visit: 08/23/2021  Referring provider: Curly Rim, MD Primary care provider: Curly Rim, MD  Chief Complaint: No chief complaint on file.  History of Present Illness: I had the pleasure of seeing Laurie Randolph for a follow up visit at the [facility] on 08/22/2021. She is a 41 y.o. female, who is being followed for asthma, allergic rhinoconjunctivitis. Her previous allergy office visit was on 05/31/2021 with Dr. Maudie Mercury. Today is a regular follow up visit.  Not well controlled moderate persistent asthma Asthma since birth but noticed worsening symptoms since December 2022.  Currently using daughter's Symbicort 80 mcg 2 puffs twice a day and albuterol daily with some benefit.  1 course of oral prednisone recently.  2 [place] pigs, 2 cats and 1 dog at home. Today's skin testing showed: Positive to mold, cat, dog, horse, dust mites.  Negative to [place] pig and common foods.  Today's spirometry was normal with 10% improvement in FEV1 postbronchodilator treatment.  Clinically feeling improved. Most likely worsened with the additional pets at home the past year.  Daily controller medication(s): START Airduo 232 mcg 1 puff twice a day with spacer and rinse mouth afterwards. Sample given.  This replaces Symbicort. Let me know if not covered or too expensive.  May use albuterol rescue inhaler 2 puffs every 4 to 6 hours as needed for shortness of breath, chest tightness, coughing, and wheezing.  Monitor frequency of use.  Get spirometry at next visit.   Other allergic rhinitis Perennial rhinoconjunctivitis symptoms for many years but worse since December 2022.  Last skin testing in her teens and was on AIT in her 43s for only 1 year.  Symptoms worst in the mornings. 2 [place] pigs, 2 cats, 1 dog at home.  Today's skin testing showed: Positive to mold, cat, dog, horse, dust mites.   Negative to [place] pig.  Start environmental control measures as below. Use over the counter antihistamines such as Zyrtec (cetirizine), Claritin (loratadine), Allegra (fexofenadine), or Xyzal (levocetirizine) daily as needed. May take twice a day during allergy flares. May switch antihistamines every few months. Start Ryaltris (olopatadine + mometasone nasal spray combination) 1-2 sprays per nostril twice a day. Sample given. This replaces Flonase. Let me know if this is too expensive. Nasal saline spray (i.e., Simply Saline) or nasal saline lavage (i.e., NeilMed) is recommended as needed and prior to medicated nasal sprays. Consider allergy injections for long term control if above medications do not help the symptoms - handout given.    Return in about 2 months (around 07/31/2021).  Assessment and Plan: Laurie Randolph is a 41 y.o. female with: No problem-specific Assessment & Plan notes found for this encounter.  No follow-ups on file.  No orders of the defined types were placed in this encounter.  Lab Orders  No laboratory test(s) ordered today    Diagnostics: Spirometry:  Tracings reviewed. Her effort: {Blank single:19197::"Good reproducible efforts.","It was hard to get consistent efforts and there is a question as to whether this reflects a maximal maneuver.","Poor effort, data can not be interpreted."} FVC: ***L FEV1: ***L, ***% predicted FEV1/FVC ratio: ***% Interpretation: {Blank single:19197::"Spirometry consistent with mild obstructive disease","Spirometry consistent with moderate obstructive disease","Spirometry consistent with severe obstructive disease","Spirometry consistent with possible restrictive disease","Spirometry consistent with mixed obstructive and restrictive disease","Spirometry uninterpretable due to technique","Spirometry consistent with normal pattern","No overt abnormalities noted given today's efforts"}.  Please see scanned spirometry  results for details.  Skin  Testing: {Blank single:19197::"Select foods","Environmental allergy panel","Environmental allergy panel and select foods","Food allergy panel","None","Deferred due to recent antihistamines use"}. *** Results discussed with patient/family.   Medication List:  Current Outpatient Medications  Medication Sig Dispense Refill  . Albuterol Sulfate, sensor, (PROAIR DIGIHALER) 108 (90 Base) MCG/ACT AEPB Inhale 2 puffs into the lungs every 4 (four) hours as needed (coughing, wheezing, shortness of breath). 1 each 1  . cetirizine (ZYRTEC) 10 MG tablet Take 10 mg by mouth daily.    . Fluticasone-Salmeterol,sensor, (AIRDUO DIGIHALER) 232-14 MCG/ACT AEPB Inhale 1 puff into the lungs in the morning and at bedtime. Rinse mouth after each use. 1 each 3  . levocetirizine (XYZAL) 5 MG tablet Take 5 mg by mouth every evening.    . olmesartan (BENICAR) 20 MG tablet Take by mouth.    Donata Clay (RYALTRIS) [id number] MCG/ACT SUSP Place 1-2 sprays into the nose in the morning and at bedtime. 29 g 5  . sertraline (ZOLOFT) 25 MG tablet Take 25 mg by mouth daily.     No current facility-administered medications for this visit.   Allergies: Allergies  Allergen Reactions  . Codeine Rash  . Hydrocodone Rash  . Penicillins Rash   I reviewed her past medical history, social history, family history, and environmental history and no significant changes have been reported from her previous visit.  Review of Systems  Constitutional:  Negative for appetite change, chills, fever and unexpected weight change.  HENT:  Positive for congestion, postnasal drip, rhinorrhea, sneezing and voice change.   Eyes:  Positive for itching.  Respiratory:  Positive for cough, chest tightness, shortness of breath and wheezing.   Cardiovascular:  Negative for chest pain.  Gastrointestinal:  Negative for abdominal pain.  Genitourinary:  Negative for difficulty urinating.  Skin:  Negative for rash.  Allergic/Immunologic:  Positive for environmental allergies. Negative for food allergies.  Neurological:  Negative for headaches.   Objective: There were no vitals taken for this visit. There is no height or weight on file to calculate BMI. Physical Exam Vitals and nursing note reviewed.  Constitutional:      Appearance: Normal appearance. She is well-developed.  HENT:     Head: Normocephalic and atraumatic.     Right Ear: Tympanic membrane and external ear normal.     Left Ear: Tympanic membrane and external ear normal.     Nose: Congestion (on right side) and rhinorrhea present.     Mouth/Throat:     Mouth: Mucous membranes are moist.     Pharynx: Oropharynx is clear.  Eyes:     Conjunctiva/sclera: Conjunctivae normal.  Cardiovascular:     Rate and Rhythm: Normal rate and regular rhythm.     Heart sounds: Normal heart sounds. No murmur heard.   No friction rub. No gallop.  Pulmonary:     Effort: Pulmonary effort is normal.     Breath sounds: Normal breath sounds. No wheezing, rhonchi or rales.  Musculoskeletal:     Cervical back: Neck supple.  Skin:    General: Skin is warm.     Findings: No rash.  Neurological:     Mental Status: She is alert and oriented to person, place, and time.  Psychiatric:        Behavior: Behavior normal.  Previous notes and tests were reviewed. The plan was reviewed with the patient/family, and all questions/concerned were addressed.  It was my pleasure to see Laurie Randolph today and participate in her care. Please feel free  to contact me with any questions or concerns.  Sincerely,  Rexene Alberts, DO Allergy & Immunology  [facility] office: [place] office: [phone]

## 2021-08-23 ENCOUNTER — Encounter: Payer: Self-pay | Admitting: Allergy

## 2021-08-23 ENCOUNTER — Other Ambulatory Visit: Payer: Self-pay | Admitting: Allergy

## 2021-08-23 ENCOUNTER — Other Ambulatory Visit: Payer: Self-pay

## 2021-08-23 ENCOUNTER — Ambulatory Visit: Payer: 59 | Admitting: Allergy

## 2021-08-23 VITALS — BP 126/80 | HR 76 | Temp 98.5°F | Resp 18 | Ht 64.0 in | Wt 166.8 lb

## 2021-08-23 DIAGNOSIS — J4540 Moderate persistent asthma, uncomplicated: Secondary | ICD-10-CM

## 2021-08-23 DIAGNOSIS — H1013 Acute atopic conjunctivitis, bilateral: Secondary | ICD-10-CM | POA: Diagnosis not present

## 2021-08-23 DIAGNOSIS — J3089 Other allergic rhinitis: Secondary | ICD-10-CM | POA: Diagnosis not present

## 2021-08-23 NOTE — Patient Instructions (Addendum)
Asthma Premedicate with albuterol 2 puffs prior to going to the horseshow. Make sure you take your albuterol and allergy medicines with you.  Daily controller medication(s): Continue Airduo 232 mcg 1 puff twice a day and rinse mouth afterwards.  May use albuterol rescue inhaler 2 puffs every 4 to 6 hours as needed for shortness of breath, chest tightness, coughing, and wheezing.  Monitor frequency of use.  Asthma control goals:  Full participation in all desired activities (may need albuterol before activity) Albuterol use two times or less a week on average (not counting use with activity) Cough interfering with sleep two times or less a month Oral steroids no more than once a year No hospitalizations   Environmental allergies 2023 skin testing was positive to mold, cat, dog, horse, dust mites.  Continue environmental control measures as below. Use over the counter antihistamines such as Zyrtec (cetirizine), Claritin (loratadine), Allegra (fexofenadine), or Xyzal (levocetirizine) daily as needed. May take twice a day during allergy flares. May switch antihistamines every few months. Continue Ryaltris (olopatadine + mometasone nasal spray combination) 1-2 sprays per nostril twice a day.  Nasal saline spray (i.e., Simply Saline) or nasal saline lavage (i.e., NeilMed) is recommended as needed and prior to medicated nasal sprays. Consider allergy injections for long term control if above medications do not help the symptoms - handout given.  Call you insurance to see how much it would be for 2 injections.   Follow up in 3 months or sooner if needed.    Pet Allergen Avoidance: Contrary to popular opinion, there are no "hypoallergenic" breeds of dogs or cats. That is because people are not allergic to an animal's hair, but to an allergen found in the animal's saliva, dander (dead skin flakes) or urine. Pet allergy symptoms typically occur within minutes. For some people, symptoms can build up and  become most severe 8 to 12 hours after contact with the animal. People with severe allergies can experience reactions in public places if dander has been transported on the pet owners' clothing. Keeping an animal outdoors is only a partial solution, since homes with pets in the yard still have higher concentrations of animal allergens. Before getting a pet, ask your allergist to determine if you are allergic to animals. If your pet is already considered part of your family, try to minimize contact and keep the pet out of the bedroom and other rooms where you spend a great deal of time. As with dust mites, vacuum carpets often or replace carpet with a hardwood floor, tile or linoleum. High-efficiency particulate air (HEPA) cleaners can reduce allergen levels over time. While dander and saliva are the source of cat and dog allergens, urine is the source of allergens from rabbits, hamsters, mice and [place] pigs; so ask a non-allergic family member to clean the animal's cage. If you have a pet allergy, talk to your allergist about the potential for allergy immunotherapy (allergy shots). This strategy can often provide long-term relief. Control of House Dust Mite Allergen Dust mite allergens are a common trigger of allergy and asthma symptoms. While they can be found throughout the house, these microscopic creatures thrive in warm, humid environments such as bedding, upholstered furniture and carpeting. Because so much time is spent in the bedroom, it is essential to reduce mite levels there.  Encase pillows, mattresses, and box springs in special allergen-proof fabric covers or airtight, zippered plastic covers.  Bedding should be washed weekly in hot water (130 F) and dried in a  hot dryer. Allergen-proof covers are available for comforters and pillows that can't be regularly washed.  Wash the allergy-proof covers every few months. Minimize clutter in the bedroom. Keep pets out of the bedroom.  Keep  humidity less than 50% by using a dehumidifier or air conditioning. You can buy a humidity measuring device called a hygrometer to monitor this.  If possible, replace carpets with hardwood, linoleum, or washable area rugs. If that's not possible, vacuum frequently with a vacuum that has a HEPA filter. Remove all upholstered furniture and non-washable window drapes from the bedroom. Remove all non-washable stuffed toys from the bedroom.  Wash stuffed toys weekly. Mold Control Mold and fungi can grow on a variety of surfaces provided certain temperature and moisture conditions exist.  Outdoor molds grow on plants, decaying vegetation and soil. The major outdoor mold, Alternaria and Cladosporium, are found in very high numbers during hot and dry conditions. Generally, a late summer - fall peak is seen for common outdoor fungal spores. Rain will temporarily lower outdoor mold spore count, but counts rise rapidly when the rainy period ends. The most important indoor molds are Aspergillus and Penicillium. Dark, humid and poorly ventilated basements are ideal sites for mold growth. The next most common sites of mold growth are the bathroom and the kitchen. Outdoor (Seasonal) Mold Control Use air conditioning and keep windows closed. Avoid exposure to decaying vegetation. Avoid leaf raking. Avoid grain handling. Consider wearing a face mask if working in moldy areas.  Indoor (Perennial) Mold Control  Maintain humidity below 50%. Get rid of mold growth on hard surfaces with water, detergent and, if necessary, 5% bleach (do not mix with other cleaners). Then dry the area completely. If mold covers an area more than 10 square feet, consider hiring an indoor environmental professional. For clothing, washing with soap and water is best. If moldy items cannot be cleaned and dried, throw them away. Remove sources e.g. contaminated carpets. Repair and seal leaking roofs or pipes. Using dehumidifiers in damp  basements may be helpful, but empty the water and clean units regularly to prevent mildew from forming. All rooms, especially basements, bathrooms and kitchens, require ventilation and cleaning to deter mold and mildew growth. Avoid carpeting on concrete or damp floors, and storing items in damp areas.

## 2021-08-23 NOTE — Assessment & Plan Note (Signed)
Past history - Perennial rhinoconjunctivitis symptoms for many years but worse since December 2022.  Last skin testing in her teens and was on AIT in her 12s for only 1 year.  Symptoms worst in the mornings. 2 [place] pigs, 2 cats, 1 dog at home. 2023 skin testing showed: Positive to mold, cat, dog, horse, dust mites.  Negative to [place] pig.  Interim history - symptoms much improved with Ryaltris.  Continue environmental control measures as below.  Use over the counter antihistamines such as Zyrtec (cetirizine), Claritin (loratadine), Allegra (fexofenadine), or Xyzal (levocetirizine) daily as needed. May take twice a day during allergy flares. May switch antihistamines every few months. . Continue Ryaltris (olopatadine + mometasone nasal spray combination) 1-2 sprays per nostril twice a day.  . Nasal saline spray (i.e., Simply Saline) or nasal saline lavage (i.e., NeilMed) is recommended as needed and prior to medicated nasal sprays.  Consider allergy injections for long term control if above medications do not help the symptoms - handout given.   Call you insurance to see how much it would be for 2 injections.

## 2021-08-23 NOTE — Assessment & Plan Note (Signed)
Past history - Asthma since birth but noticed worsening symptoms since December 2022.  Currently using daughter's Symbicort 80 mcg 2 puffs twice a day and albuterol daily with some benefit.  1 course of oral prednisone recently.  2 [place] pigs, 2 cats and 1 dog at home. 2023 spirometry was normal with 10% improvement in FEV1 postbronchodilator treatment.  Clinically feeling improved. Interim history - doing much better with Airduo even when around horses.   Today's spirometry was normal.  . Premedicate with albuterol 2 puffs prior to going to the horseshow. . Make sure you take your albuterol and allergy medicines with you. . Daily controller medication(s): Continue Airduo 232 mcg 1 puff twice a day and rinse mouth afterwards.  . May use albuterol rescue inhaler 2 puffs every 4 to 6 hours as needed for shortness of breath, chest tightness, coughing, and wheezing.  Monitor frequency of use.  . Get spirometry at next visit.

## 2021-08-26 DIAGNOSIS — H10021 Other mucopurulent conjunctivitis, right eye: Secondary | ICD-10-CM | POA: Diagnosis not present

## 2021-08-29 DIAGNOSIS — Z20822 Contact with and (suspected) exposure to covid-19: Secondary | ICD-10-CM | POA: Diagnosis not present

## 2021-08-29 DIAGNOSIS — Z03818 Encounter for observation for suspected exposure to other biological agents ruled out: Secondary | ICD-10-CM | POA: Diagnosis not present

## 2021-08-29 DIAGNOSIS — R051 Acute cough: Secondary | ICD-10-CM | POA: Diagnosis not present

## 2021-08-29 DIAGNOSIS — H1033 Unspecified acute conjunctivitis, bilateral: Secondary | ICD-10-CM | POA: Diagnosis not present

## 2021-08-29 DIAGNOSIS — H10023 Other mucopurulent conjunctivitis, bilateral: Secondary | ICD-10-CM | POA: Diagnosis not present

## 2021-08-29 DIAGNOSIS — J029 Acute pharyngitis, unspecified: Secondary | ICD-10-CM | POA: Diagnosis not present

## 2021-09-05 DIAGNOSIS — M6208 Separation of muscle (nontraumatic), other site: Secondary | ICD-10-CM | POA: Diagnosis not present

## 2021-09-05 DIAGNOSIS — N6009 Solitary cyst of unspecified breast: Secondary | ICD-10-CM | POA: Diagnosis not present

## 2021-09-05 DIAGNOSIS — N898 Other specified noninflammatory disorders of vagina: Secondary | ICD-10-CM | POA: Diagnosis not present

## 2021-09-05 DIAGNOSIS — D509 Iron deficiency anemia, unspecified: Secondary | ICD-10-CM | POA: Diagnosis not present

## 2021-09-05 DIAGNOSIS — I10 Essential (primary) hypertension: Secondary | ICD-10-CM | POA: Diagnosis not present

## 2021-10-10 DIAGNOSIS — M62838 Other muscle spasm: Secondary | ICD-10-CM | POA: Diagnosis not present

## 2021-10-10 DIAGNOSIS — M6208 Separation of muscle (nontraumatic), other site: Secondary | ICD-10-CM | POA: Diagnosis not present

## 2021-10-10 DIAGNOSIS — M6281 Muscle weakness (generalized): Secondary | ICD-10-CM | POA: Diagnosis not present

## 2021-10-10 DIAGNOSIS — R102 Pelvic and perineal pain: Secondary | ICD-10-CM | POA: Diagnosis not present

## 2021-10-21 DIAGNOSIS — N3946 Mixed incontinence: Secondary | ICD-10-CM | POA: Diagnosis not present

## 2021-10-21 DIAGNOSIS — N898 Other specified noninflammatory disorders of vagina: Secondary | ICD-10-CM | POA: Diagnosis not present

## 2021-10-27 DIAGNOSIS — M62838 Other muscle spasm: Secondary | ICD-10-CM | POA: Diagnosis not present

## 2021-10-27 DIAGNOSIS — M6281 Muscle weakness (generalized): Secondary | ICD-10-CM | POA: Diagnosis not present

## 2021-10-27 DIAGNOSIS — M6208 Separation of muscle (nontraumatic), other site: Secondary | ICD-10-CM | POA: Diagnosis not present

## 2021-10-27 DIAGNOSIS — R102 Pelvic and perineal pain: Secondary | ICD-10-CM | POA: Diagnosis not present

## 2021-11-14 DIAGNOSIS — M6208 Separation of muscle (nontraumatic), other site: Secondary | ICD-10-CM | POA: Diagnosis not present

## 2021-11-14 DIAGNOSIS — M62838 Other muscle spasm: Secondary | ICD-10-CM | POA: Diagnosis not present

## 2021-11-14 DIAGNOSIS — M6281 Muscle weakness (generalized): Secondary | ICD-10-CM | POA: Diagnosis not present

## 2021-11-14 DIAGNOSIS — R102 Pelvic and perineal pain: Secondary | ICD-10-CM | POA: Diagnosis not present

## 2021-11-29 DIAGNOSIS — N898 Other specified noninflammatory disorders of vagina: Secondary | ICD-10-CM | POA: Diagnosis not present

## 2021-11-30 ENCOUNTER — Other Ambulatory Visit: Payer: Self-pay | Admitting: Physician Assistant

## 2021-11-30 DIAGNOSIS — Z1231 Encounter for screening mammogram for malignant neoplasm of breast: Secondary | ICD-10-CM

## 2021-11-30 DIAGNOSIS — N6312 Unspecified lump in the right breast, upper inner quadrant: Secondary | ICD-10-CM

## 2021-11-30 DIAGNOSIS — Z803 Family history of malignant neoplasm of breast: Secondary | ICD-10-CM

## 2021-12-20 ENCOUNTER — Other Ambulatory Visit: Payer: Self-pay

## 2021-12-20 MED ORDER — RYALTRIS 665-25 MCG/ACT NA SUSP: 1.0000 | Freq: Two times a day (BID) | NASAL | 5 refills | Status: DC

## 2021-12-23 DIAGNOSIS — N898 Other specified noninflammatory disorders of vagina: Secondary | ICD-10-CM | POA: Diagnosis not present

## 2021-12-23 DIAGNOSIS — Q524 Other congenital malformations of vagina: Secondary | ICD-10-CM | POA: Diagnosis not present

## 2022-01-17 DIAGNOSIS — N6312 Unspecified lump in the right breast, upper inner quadrant: Secondary | ICD-10-CM | POA: Diagnosis not present

## 2022-01-17 DIAGNOSIS — D241 Benign neoplasm of right breast: Secondary | ICD-10-CM | POA: Diagnosis not present

## 2022-01-17 DIAGNOSIS — Z803 Family history of malignant neoplasm of breast: Secondary | ICD-10-CM | POA: Diagnosis not present

## 2022-01-30 DIAGNOSIS — Z9189 Other specified personal risk factors, not elsewhere classified: Secondary | ICD-10-CM | POA: Diagnosis not present

## 2022-01-30 DIAGNOSIS — Z803 Family history of malignant neoplasm of breast: Secondary | ICD-10-CM | POA: Diagnosis not present

## 2022-01-31 DIAGNOSIS — Z01810 Encounter for preprocedural cardiovascular examination: Secondary | ICD-10-CM | POA: Diagnosis not present

## 2022-01-31 DIAGNOSIS — N907 Vulvar cyst: Secondary | ICD-10-CM | POA: Diagnosis not present

## 2022-01-31 DIAGNOSIS — N898 Other specified noninflammatory disorders of vagina: Secondary | ICD-10-CM | POA: Diagnosis not present

## 2022-03-15 ENCOUNTER — Other Ambulatory Visit (HOSPITAL_COMMUNITY): Payer: Self-pay

## 2022-03-23 ENCOUNTER — Ambulatory Visit (INDEPENDENT_AMBULATORY_CARE_PROVIDER_SITE_OTHER): Payer: 59 | Admitting: Allergy

## 2022-03-23 ENCOUNTER — Encounter: Payer: Self-pay | Admitting: Allergy

## 2022-03-23 ENCOUNTER — Other Ambulatory Visit: Payer: Self-pay

## 2022-03-23 VITALS — BP 130/82 | HR 65 | Temp 98.4°F | Resp 16 | Ht 64.0 in | Wt 167.0 lb

## 2022-03-23 DIAGNOSIS — J4540 Moderate persistent asthma, uncomplicated: Secondary | ICD-10-CM

## 2022-03-23 DIAGNOSIS — J3089 Other allergic rhinitis: Secondary | ICD-10-CM

## 2022-03-23 MED ORDER — AIRDUO DIGIHALER 232-14 MCG/ACT IN AEPB: 1.0000 | INHALATION_SPRAY | Freq: Two times a day (BID) | RESPIRATORY_TRACT | 3 refills | Status: DC

## 2022-03-23 MED ORDER — LEVOCETIRIZINE DIHYDROCHLORIDE 5 MG PO TABS: 5.0000 mg | ORAL_TABLET | Freq: Every evening | ORAL | 1 refills | Status: AC

## 2022-03-23 MED ORDER — ALBUTEROL SULFATE (2.5 MG/3ML) 0.083% IN NEBU: 2.5000 mg | INHALATION_SOLUTION | RESPIRATORY_TRACT | 1 refills | Status: AC | PRN

## 2022-03-23 NOTE — Assessment & Plan Note (Signed)
Past history - Asthma since birth but noticed worsening symptoms since December 2022.  Currently using daughter's Symbicort 80 mcg 2 puffs twice a day and albuterol daily with some benefit.  1 course of oral prednisone recently.  2 [place] pigs, 2 cats and 1 dog at home. 2023 spirometry was normal with 10% improvement in FEV1 postbronchodilator treatment.  Clinically feeling improved. Interim history - symptoms flared the last few days and using albuterol daily. Ran out of Airduo.   Today's spirometry showed mixed obstruction and restriction.  Use albuterol nebulizer twice a day for the next few days.  If symptoms not improved, may need prednisone.  Daily controller medication(s): Continue Airduo 232 mcg 1 puff twice a day and rinse mouth afterwards.  Let me know if it's not covered. Coupon given.  Meanwhile take Armonair 268mg 1 puff twice a day and rinse mouth after each use - sample given.  May use Airsupra rescue inhaler 2 puffs every 4 to 6 hours as needed for shortness of breath, chest tightness, coughing, and wheezing. Do not use more than 12 puffs in 24 hours. May use Airsupra rescue inhaler 2 puffs 5 to 15 minutes prior to strenuous physical activities. Monitor frequency of use. Rinse mouth after each use.  Coupon given. Sample given.  Get spirometry at next visit.
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
## 2022-03-23 NOTE — Patient Instructions (Addendum)
Asthma Use albuterol nebulizer twice a day for the next few days.  If symptoms not improved, may need prednisone.   Daily controller medication(s): Continue Airduo 232 mcg 1 puff twice a day and rinse mouth afterwards.  Let me know if it's not covered. Meanwhile take Armonair 244mg 1 puff twice a day and rinse mouth after each use - sample given, coupon given. May use Airsupra rescue inhaler 2 puffs every 4 to 6 hours as needed for shortness of breath, chest tightness, coughing, and wheezing. Do not use more than 12 puffs in 24 hours. May use Airsupra rescue inhaler 2 puffs 5 to 15 minutes prior to strenuous physical activities. Monitor frequency of use. Rinse mouth after each use.  Coupon given. Sample given.  Asthma control goals:  Full participation in all desired activities (may need albuterol before activity) Albuterol use two times or less a week on average (not counting use with activity) Cough interfering with sleep two times or less a month Oral steroids no more than once a year No hospitalizations   Environmental allergies 2023 skin testing was positive to mold, cat, dog, horse, dust mites.  Continue environmental control measures as below. Use over the counter antihistamines such as Zyrtec (cetirizine), Claritin (loratadine), Allegra (fexofenadine), or Xyzal (levocetirizine) daily as needed. May take twice a day during allergy flares. May switch antihistamines every few months. Continue Ryaltris (olopatadine + mometasone nasal spray combination) 1-2 sprays per nostril twice a day.  Nasal saline spray (i.e., Simply Saline) or nasal saline lavage (i.e., NeilMed) is recommended as needed and prior to medicated nasal sprays. Consider allergy injections for long term control if above medications do not help the symptoms.   Refer to ENT - right side polyp?  Follow up in 2 months or sooner if needed.    Pet Allergen Avoidance: Contrary to popular opinion, there are no  "hypoallergenic" breeds of dogs or cats. That is because people are not allergic to an animal's hair, but to an allergen found in the animal's saliva, dander (dead skin flakes) or urine. Pet allergy symptoms typically occur within minutes. For some people, symptoms can build up and become most severe 8 to 12 hours after contact with the animal. People with severe allergies can experience reactions in public places if dander has been transported on the pet owners' clothing. Keeping an animal outdoors is only a partial solution, since homes with pets in the yard still have higher concentrations of animal allergens. Before getting a pet, ask your allergist to determine if you are allergic to animals. If your pet is already considered part of your family, try to minimize contact and keep the pet out of the bedroom and other rooms where you spend a great deal of time. As with dust mites, vacuum carpets often or replace carpet with a hardwood floor, tile or linoleum. High-efficiency particulate air (HEPA) cleaners can reduce allergen levels over time. While dander and saliva are the source of cat and dog allergens, urine is the source of allergens from rabbits, hamsters, mice and [place]; so ask a non-allergic family member to clean the animal's cage. If you have a pet allergy, talk to your allergist about the potential for allergy immunotherapy (allergy shots). This strategy can often provide long-term relief. Control of House Dust Mite Allergen Dust mite allergens are a common trigger of allergy and asthma symptoms. While they can be found throughout the house, these microscopic creatures thrive in warm, humid environments such as bedding, upholstered furniture  and carpeting. Because so much time is spent in the bedroom, it is essential to reduce mite levels there.  Encase pillows, mattresses, and box springs in special allergen-proof fabric covers or airtight, zippered plastic covers.  Bedding should be  washed weekly in hot water (130 F) and dried in a hot dryer. Allergen-proof covers are available for comforters and pillows that can't be regularly washed.  Wash the allergy-proof covers every few months. Minimize clutter in the bedroom. Keep pets out of the bedroom.  Keep humidity less than 50% by using a dehumidifier or air conditioning. You can buy a humidity measuring device called a hygrometer to monitor this.  If possible, replace carpets with hardwood, linoleum, or washable area rugs. If that's not possible, vacuum frequently with a vacuum that has a HEPA filter. Remove all upholstered furniture and non-washable window drapes from the bedroom. Remove all non-washable stuffed toys from the bedroom.  Wash stuffed toys weekly. Mold Control Mold and fungi can grow on a variety of surfaces provided certain temperature and moisture conditions exist.  Outdoor molds grow on plants, decaying vegetation and soil. The major outdoor mold, Alternaria and Cladosporium, are found in very high numbers during hot and dry conditions. Generally, a late summer - fall peak is seen for common outdoor fungal spores. Rain will temporarily lower outdoor mold spore count, but counts rise rapidly when the rainy period ends. The most important indoor molds are Aspergillus and Penicillium. Dark, humid and poorly ventilated basements are ideal sites for mold growth. The next most common sites of mold growth are the bathroom and the kitchen. Outdoor (Seasonal) Mold Control Use air conditioning and keep windows closed. Avoid exposure to decaying vegetation. Avoid leaf raking. Avoid grain handling. Consider wearing a face mask if working in moldy areas.  Indoor (Perennial) Mold Control  Maintain humidity below 50%. Get rid of mold growth on hard surfaces with water, detergent and, if necessary, 5% bleach (do not mix with other cleaners). Then dry the area completely. If mold covers an area more than 10 square feet,  consider hiring an indoor environmental professional. For clothing, washing with soap and water is best. If moldy items cannot be cleaned and dried, throw them away. Remove sources e.g. contaminated carpets. Repair and seal leaking roofs or pipes. Using dehumidifiers in damp basements may be helpful, but empty the water and clean units regularly to prevent mildew from forming. All rooms, especially basements, bathrooms and kitchens, require ventilation and cleaning to deter mold and mildew growth. Avoid carpeting on concrete or damp floors, and storing items in damp areas.

## 2022-03-23 NOTE — Addendum Note (Signed)
Addended by: Garnet Sierras on: 03/23/2022 05:18 PM   Modules accepted: Orders

## 2022-03-24 DIAGNOSIS — Q506 Other congenital malformations of fallopian tube and broad ligament: Secondary | ICD-10-CM | POA: Diagnosis not present

## 2022-05-10 DIAGNOSIS — Z1159 Encounter for screening for other viral diseases: Secondary | ICD-10-CM | POA: Diagnosis not present

## 2022-05-10 DIAGNOSIS — M67431 Ganglion, right wrist: Secondary | ICD-10-CM | POA: Diagnosis not present

## 2022-05-10 DIAGNOSIS — D509 Iron deficiency anemia, unspecified: Secondary | ICD-10-CM | POA: Diagnosis not present

## 2022-05-10 DIAGNOSIS — I10 Essential (primary) hypertension: Secondary | ICD-10-CM | POA: Diagnosis not present

## 2022-05-10 DIAGNOSIS — Z0000 Encounter for general adult medical examination without abnormal findings: Secondary | ICD-10-CM | POA: Diagnosis not present

## 2022-05-10 DIAGNOSIS — Z23 Encounter for immunization: Secondary | ICD-10-CM | POA: Diagnosis not present

## 2022-05-15 DIAGNOSIS — M67431 Ganglion, right wrist: Secondary | ICD-10-CM | POA: Diagnosis not present

## 2022-05-15 DIAGNOSIS — R2231 Localized swelling, mass and lump, right upper limb: Secondary | ICD-10-CM | POA: Diagnosis not present

## 2022-05-23 ENCOUNTER — Other Ambulatory Visit: Payer: Self-pay

## 2022-05-23 ENCOUNTER — Ambulatory Visit: Payer: 59 | Admitting: Allergy

## 2022-05-23 ENCOUNTER — Encounter: Payer: Self-pay | Admitting: Allergy

## 2022-05-23 VITALS — BP 122/78 | HR 91 | Temp 98.7°F | Resp 18

## 2022-05-23 DIAGNOSIS — J988 Other specified respiratory disorders: Secondary | ICD-10-CM | POA: Diagnosis not present

## 2022-05-23 DIAGNOSIS — J3089 Other allergic rhinitis: Secondary | ICD-10-CM

## 2022-05-23 DIAGNOSIS — J4540 Moderate persistent asthma, uncomplicated: Secondary | ICD-10-CM

## 2022-05-23 MED ORDER — DOXYCYCLINE MONOHYDRATE 100 MG PO TABS: 100.0000 mg | ORAL_TABLET | Freq: Two times a day (BID) | ORAL | 0 refills | Status: DC

## 2022-05-23 MED ORDER — PREDNISONE 10 MG PO TABS: ORAL_TABLET | ORAL | 0 refills | Status: DC

## 2022-05-23 NOTE — Assessment & Plan Note (Signed)
Past history - Asthma since birth but noticed worsening symptoms since December 2022.  Currently using daughter's Symbicort 80 mcg 2 puffs twice a day and albuterol daily with some benefit.  1 course of oral prednisone recently.  2 [place] pigs, 2 cats and 1 dog at home. 2023 spirometry was normal with 10% improvement in FEV1 postbronchodilator treatment.  Clinically feeling improved. 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  May use albuterol nebulizer every 6 hours as needed. Daily controller medication(s): Continue Airduo 232 mcg 1 puff twice a day and rinse mouth afterwards.  May use Airsupra rescue inhaler 2 puffs every 4 to 6 hours as needed for shortness of breath, chest tightness, coughing, and wheezing. Do not use more than 12 puffs in 24 hours. May use Airsupra rescue inhaler 2 puffs 5 to 15 minutes prior to strenuous physical activities. Monitor frequency of use. Rinse mouth after each use.  Get spirometry at next visit.
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
## 2022-05-23 NOTE — Patient Instructions (Addendum)
Possible sinus infection Start doxycycline '100mg'$  twice a day for 10 days. Start prednisone taper. Prednisone '10mg'$  tablets - take 2 tablets for 4 days then 1 tablet on day 5.   Asthma May use albuterol nebulizer every 6 hours as needed.  Daily controller medication(s): Continue Airduo 232 mcg 1 puff twice a day and rinse mouth afterwards.  May use Airsupra rescue inhaler 2 puffs every 4 to 6 hours as needed for shortness of breath, chest tightness, coughing, and wheezing. Do not use more than 12 puffs in 24 hours. May use Airsupra rescue inhaler 2 puffs 5 to 15 minutes prior to strenuous physical activities. Monitor frequency of use. Rinse mouth after each use.  Asthma control goals:  Full participation in all desired activities (may need albuterol before activity) Albuterol use two times or less a week on average (not counting use with activity) Cough interfering with sleep two times or less a month Oral steroids no more than once a year No hospitalizations   Environmental allergies 2023 skin testing was positive to mold, cat, dog, horse, dust mites.  Continue environmental control measures as below. Use over the counter antihistamines such as Zyrtec (cetirizine), Claritin (loratadine), Allegra (fexofenadine), or Xyzal (levocetirizine) daily as needed. May take twice a day during allergy flares. May switch antihistamines every few months. Continue Ryaltris (olopatadine + mometasone nasal spray combination) 2 sprays per nostril twice a day.  Nasal saline spray (i.e., Simply Saline) or nasal saline lavage (i.e., NeilMed) is recommended as needed and prior to medicated nasal sprays. Consider allergy injections for long term control if above medications do not help the symptoms.   Follow up in 2 months or sooner if needed.    Drink plenty of fluids. Water, juice, clear broth or warm lemon water are good choices. Avoid caffeine and alcohol, which can dehydrate you. Eat chicken soup. Chicken  soup and other warm fluids can be soothing and loosen congestion. Rest. Adjust your room's temperature and humidity. Keep your room warm but not overheated. If the air is dry, a cool-mist humidifier or vaporizer can moisten the air and help ease congestion and coughing. Keep the humidifier clean to prevent the growth of bacteria and molds. Soothe your throat. Perform a saltwater gargle. Dissolve one-quarter to a half teaspoon of salt in a 4- to 8-ounce glass of warm water. This can relieve a sore or scratchy throat temporarily. Use saline nasal drops. To help relieve nasal congestion, try saline nasal drops. You can buy these drops over the counter, and they can help relieve symptoms ? even in children. Take over-the-counter cold and cough medications. For adults and children older than 5, over-the-counter decongestants, antihistamines and pain relievers might offer some symptom relief. However, they won't prevent a cold or shorten its duration.  Pet Allergen Avoidance: Contrary to popular opinion, there are no "hypoallergenic" breeds of dogs or cats. That is because people are not allergic to an animal's hair, but to an allergen found in the animal's saliva, dander (dead skin flakes) or urine. Pet allergy symptoms typically occur within minutes. For some people, symptoms can build up and become most severe 8 to 12 hours after contact with the animal. People with severe allergies can experience reactions in public places if dander has been transported on the pet owners' clothing. Keeping an animal outdoors is only a partial solution, since homes with pets in the yard still have higher concentrations of animal allergens. Before getting a pet, ask your allergist to determine if you  are allergic to animals. If your pet is already considered part of your family, try to minimize contact and keep the pet out of the bedroom and other rooms where you spend a great deal of time. As with dust mites, vacuum  carpets often or replace carpet with a hardwood floor, tile or linoleum. High-efficiency particulate air (HEPA) cleaners can reduce allergen levels over time. While dander and saliva are the source of cat and dog allergens, urine is the source of allergens from rabbits, hamsters, mice and [place] pigs; so ask a non-allergic family member to clean the animal's cage. If you have a pet allergy, talk to your allergist about the potential for allergy immunotherapy (allergy shots). This strategy can often provide long-term relief. Control of House Dust Mite Allergen Dust mite allergens are a common trigger of allergy and asthma symptoms. While they can be found throughout the house, these microscopic creatures thrive in warm, humid environments such as bedding, upholstered furniture and carpeting. Because so much time is spent in the bedroom, it is essential to reduce mite levels there.  Encase pillows, mattresses, and box springs in special allergen-proof fabric covers or airtight, zippered plastic covers.  Bedding should be washed weekly in hot water (130 F) and dried in a hot dryer. Allergen-proof covers are available for comforters and pillows that can't be regularly washed.  Wash the allergy-proof covers every few months. Minimize clutter in the bedroom. Keep pets out of the bedroom.  Keep humidity less than 50% by using a dehumidifier or air conditioning. You can buy a humidity measuring device called a hygrometer to monitor this.  If possible, replace carpets with hardwood, linoleum, or washable area rugs. If that's not possible, vacuum frequently with a vacuum that has a HEPA filter. Remove all upholstered furniture and non-washable window drapes from the bedroom. Remove all non-washable stuffed toys from the bedroom.  Wash stuffed toys weekly. Mold Control Mold and fungi can grow on a variety of surfaces provided certain temperature and moisture conditions exist.  Outdoor molds grow on plants,  decaying vegetation and soil. The major outdoor mold, Alternaria and Cladosporium, are found in very high numbers during hot and dry conditions. Generally, a late summer - fall peak is seen for common outdoor fungal spores. Rain will temporarily lower outdoor mold spore count, but counts rise rapidly when the rainy period ends. The most important indoor molds are Aspergillus and Penicillium. Dark, humid and poorly ventilated basements are ideal sites for mold growth. The next most common sites of mold growth are the bathroom and the kitchen. Outdoor (Seasonal) Mold Control Use air conditioning and keep windows closed. Avoid exposure to decaying vegetation. Avoid leaf raking. Avoid grain handling. Consider wearing a face mask if working in moldy areas.  Indoor (Perennial) Mold Control  Maintain humidity below 50%. Get rid of mold growth on hard surfaces with water, detergent and, if necessary, 5% bleach (do not mix with other cleaners). Then dry the area completely. If mold covers an area more than 10 square feet, consider hiring an indoor environmental professional. For clothing, washing with soap and water is best. If moldy items cannot be cleaned and dried, throw them away. Remove sources e.g. contaminated carpets. Repair and seal leaking roofs or pipes. Using dehumidifiers in damp basements may be helpful, but empty the water and clean units regularly to prevent mildew from forming. All rooms, especially basements, bathrooms and kitchens, require ventilation and cleaning to deter mold and mildew growth. Avoid carpeting on concrete  or damp floors, and storing items in damp areas.

## 2022-05-23 NOTE — Assessment & Plan Note (Signed)
Persistent symptoms x 2 weeks now. No fevers but has discolored nasal drainage. Possible sinus infection. Start doxycycline '100mg'$  twice a day for 10 days. Start prednisone taper. Prednisone '10mg'$  tablets - take 2 tablets for 4 days then 1 tablet on day 5.  See below for symptomatic management.
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
## 2022-06-13 DIAGNOSIS — M67431 Ganglion, right wrist: Secondary | ICD-10-CM | POA: Diagnosis not present

## 2022-07-12 DIAGNOSIS — Z9189 Other specified personal risk factors, not elsewhere classified: Secondary | ICD-10-CM | POA: Diagnosis not present

## 2022-07-12 DIAGNOSIS — Z1239 Encounter for other screening for malignant neoplasm of breast: Secondary | ICD-10-CM | POA: Diagnosis not present
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
## 2022-07-20 ENCOUNTER — Telehealth: Payer: Self-pay

## 2022-07-20 ENCOUNTER — Other Ambulatory Visit: Payer: Self-pay

## 2022-07-20 ENCOUNTER — Encounter: Payer: Self-pay | Admitting: Allergy

## 2022-07-20 ENCOUNTER — Ambulatory Visit: Payer: 59 | Admitting: Allergy

## 2022-07-20 VITALS — BP 108/76 | HR 65 | Temp 97.4°F | Resp 16 | Ht 64.25 in | Wt 161.5 lb

## 2022-07-20 DIAGNOSIS — J3089 Other allergic rhinitis: Secondary | ICD-10-CM

## 2022-07-20 DIAGNOSIS — J4540 Moderate persistent asthma, uncomplicated: Secondary | ICD-10-CM

## 2022-07-20 MED ORDER — BUDESONIDE-FORMOTEROL FUMARATE 160-4.5 MCG/ACT IN AERO: 2.0000 | INHALATION_SPRAY | Freq: Two times a day (BID) | RESPIRATORY_TRACT | 5 refills | Status: DC

## 2022-07-20 MED ORDER — MONTELUKAST SODIUM 10 MG PO TABS
10.0000 mg | ORAL_TABLET | Freq: Every day | ORAL | 5 refills | Status: DC
Start: 2022-07-20 — End: 2023-02-27

## 2022-07-20 NOTE — Assessment & Plan Note (Signed)
Past history - Asthma since birth but noticed worsening symptoms since December 2022.  Currently using daughter's Symbicort 80 mcg 2 puffs twice a day and albuterol daily with some benefit.  1 course of oral prednisone recently.  2 [place] pigs, 2 cats and 1 dog at home. 2023 spirometry was normal with 10% improvement in FEV1 postbronchodilator treatment.  Clinically feeling improved. Interim history - flaring since not taking Airduo x 1 month due to refill issues and has 6 dogs now at home. Today's spirometry showed mild obstruction - just used rescue inhaler before visit. Daily controller medication(s): start Symbicort 2 puffs twice a day with spacer and rinse mouth afterwards. If not covered, check the pricing for the following inhalers: Dulera Advair HFA Advair Diskus Wixela Breo Spacer given and demonstrated proper use with inhaler. Patient understood technique and all questions/concerned were addressed.  May use Airsupra or albuterol rescue inhaler 2 puffs every 4 to 6 hours as needed for shortness of breath, chest tightness, coughing, and wheezing. Do not use more than 12 puffs in 24 hours. May use Airsupra or albuterol rescue inhaler 2 puffs 5 to 15 minutes prior to strenuous physical activities. Monitor frequency of use. Rinse mouth after each use.  Get spirometry at next visit.
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
## 2022-09-15 DIAGNOSIS — M67431 Ganglion, right wrist: Secondary | ICD-10-CM | POA: Diagnosis not present

## 2022-10-09 DIAGNOSIS — Z03818 Encounter for observation for suspected exposure to other biological agents ruled out: Secondary | ICD-10-CM | POA: Diagnosis not present

## 2022-10-09 DIAGNOSIS — J45901 Unspecified asthma with (acute) exacerbation: Secondary | ICD-10-CM | POA: Diagnosis not present

## 2022-10-09 DIAGNOSIS — H6693 Otitis media, unspecified, bilateral: Secondary | ICD-10-CM | POA: Diagnosis not present

## 2022-10-09 DIAGNOSIS — H9203 Otalgia, bilateral: Secondary | ICD-10-CM | POA: Diagnosis not present
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
## 2022-10-31 ENCOUNTER — Ambulatory Visit: Payer: 59 | Admitting: Allergy

## 2022-10-31 DIAGNOSIS — J4540 Moderate persistent asthma, uncomplicated: Secondary | ICD-10-CM

## 2022-10-31 DIAGNOSIS — J309 Allergic rhinitis, unspecified: Secondary | ICD-10-CM

## 2022-10-31 DIAGNOSIS — J3089 Other allergic rhinitis: Secondary | ICD-10-CM

## 2023-02-26 DIAGNOSIS — Z833 Family history of diabetes mellitus: Secondary | ICD-10-CM | POA: Diagnosis not present

## 2023-02-26 DIAGNOSIS — Z7951 Long term (current) use of inhaled steroids: Secondary | ICD-10-CM | POA: Diagnosis not present

## 2023-02-26 DIAGNOSIS — Z8249 Family history of ischemic heart disease and other diseases of the circulatory system: Secondary | ICD-10-CM | POA: Diagnosis not present

## 2023-02-26 DIAGNOSIS — R32 Unspecified urinary incontinence: Secondary | ICD-10-CM | POA: Diagnosis not present

## 2023-02-26 DIAGNOSIS — J45909 Unspecified asthma, uncomplicated: Secondary | ICD-10-CM | POA: Diagnosis not present

## 2023-02-26 DIAGNOSIS — Z880 Allergy status to penicillin: Secondary | ICD-10-CM | POA: Diagnosis not present

## 2023-02-26 DIAGNOSIS — Z809 Family history of malignant neoplasm, unspecified: Secondary | ICD-10-CM | POA: Diagnosis not present

## 2023-02-26 DIAGNOSIS — Z885 Allergy status to narcotic agent status: Secondary | ICD-10-CM | POA: Diagnosis not present

## 2023-02-26 DIAGNOSIS — I10 Essential (primary) hypertension: Secondary | ICD-10-CM | POA: Diagnosis not present

## 2023-02-27 ENCOUNTER — Ambulatory Visit (INDEPENDENT_AMBULATORY_CARE_PROVIDER_SITE_OTHER): Payer: BC Managed Care – PPO | Admitting: Allergy

## 2023-02-27 ENCOUNTER — Telehealth: Payer: Self-pay

## 2023-02-27 ENCOUNTER — Encounter: Payer: Self-pay | Admitting: Allergy

## 2023-02-27 VITALS — BP 110/70 | HR 82 | Temp 98.6°F | Resp 16 | Wt 161.2 lb

## 2023-02-27 DIAGNOSIS — J3081 Allergic rhinitis due to animal (cat) (dog) hair and dander: Secondary | ICD-10-CM

## 2023-02-27 DIAGNOSIS — J3089 Other allergic rhinitis: Secondary | ICD-10-CM

## 2023-02-27 DIAGNOSIS — J4540 Moderate persistent asthma, uncomplicated: Secondary | ICD-10-CM

## 2023-02-27 MED ORDER — BUDESONIDE-FORMOTEROL FUMARATE 160-4.5 MCG/ACT IN AERO: 2.0000 | INHALATION_SPRAY | Freq: Two times a day (BID) | RESPIRATORY_TRACT | 5 refills | Status: AC

## 2023-02-27 MED ORDER — RYALTRIS 665-25 MCG/ACT NA SUSP: 1.0000 | Freq: Two times a day (BID) | NASAL | 5 refills | Status: AC

## 2023-02-27 MED ORDER — ALBUTEROL SULFATE HFA 108 (90 BASE) MCG/ACT IN AERS: 2.0000 | INHALATION_SPRAY | RESPIRATORY_TRACT | 1 refills | Status: AC | PRN

## 2023-02-27 MED ORDER — MONTELUKAST SODIUM 10 MG PO TABS: 10.0000 mg | ORAL_TABLET | Freq: Every day | ORAL | 5 refills | Status: AC
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
## 2023-03-06 DIAGNOSIS — Z803 Family history of malignant neoplasm of breast: Secondary | ICD-10-CM | POA: Diagnosis not present

## 2023-03-06 DIAGNOSIS — Z1231 Encounter for screening mammogram for malignant neoplasm of breast: Secondary | ICD-10-CM | POA: Diagnosis not present

## 2023-03-12 DIAGNOSIS — Z803 Family history of malignant neoplasm of breast: Secondary | ICD-10-CM | POA: Diagnosis not present

## 2023-03-12 DIAGNOSIS — Z9189 Other specified personal risk factors, not elsewhere classified: Secondary | ICD-10-CM | POA: Diagnosis not present

## 2023-04-30 ENCOUNTER — Telehealth (INDEPENDENT_AMBULATORY_CARE_PROVIDER_SITE_OTHER): Payer: Self-pay | Admitting: Otolaryngology

## 2023-04-30 ENCOUNTER — Encounter (INDEPENDENT_AMBULATORY_CARE_PROVIDER_SITE_OTHER): Payer: Self-pay

## 2023-04-30 NOTE — Telephone Encounter (Signed)
Reminder Call: Date: 05/01/2023 Status: Sch  Time: 9:00 AM [address] [place], [place] [address]  Patient confirmed-TEXT YES, 04/30/2023 8:22 AM- Called to confirm location, VM not set up. Left mychart message.

## 2023-05-01 ENCOUNTER — Ambulatory Visit (INDEPENDENT_AMBULATORY_CARE_PROVIDER_SITE_OTHER): Payer: No Typology Code available for payment source | Admitting: Otolaryngology

## 2023-05-01 ENCOUNTER — Encounter (INDEPENDENT_AMBULATORY_CARE_PROVIDER_SITE_OTHER): Payer: Self-pay

## 2023-05-01 VITALS — BP 117/78 | HR 72 | Ht 67.0 in | Wt 163.0 lb

## 2023-05-01 DIAGNOSIS — J3089 Other allergic rhinitis: Secondary | ICD-10-CM | POA: Diagnosis not present

## 2023-05-01 DIAGNOSIS — J0181 Other acute recurrent sinusitis: Secondary | ICD-10-CM | POA: Diagnosis not present

## 2023-05-01 DIAGNOSIS — Z87828 Personal history of other (healed) physical injury and trauma: Secondary | ICD-10-CM | POA: Diagnosis not present
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
